# Patient Record
Sex: Female | Born: 1980 | Race: Black or African American | Hispanic: No | Marital: Married | Smoking: Never smoker
Health system: Southern US, Community
[De-identification: ages and names within clinical notes are randomized; demographics above are authoritative.]

## PROBLEM LIST (undated history)

## (undated) ENCOUNTER — Inpatient Hospital Stay (HOSPITAL_COMMUNITY): Payer: Self-pay

## (undated) DIAGNOSIS — D219 Benign neoplasm of connective and other soft tissue, unspecified: Secondary | ICD-10-CM

## (undated) DIAGNOSIS — B54 Unspecified malaria: Secondary | ICD-10-CM

## (undated) DIAGNOSIS — K219 Gastro-esophageal reflux disease without esophagitis: Secondary | ICD-10-CM

## (undated) HISTORY — DX: Unspecified malaria: B54

## (undated) HISTORY — PX: NO PAST SURGERIES: SHX2092

---
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
## 2014-07-26 ENCOUNTER — Encounter: Payer: Self-pay | Admitting: *Deleted

## 2014-08-10 ENCOUNTER — Encounter: Payer: Self-pay | Admitting: Obstetrics and Gynecology

## 2014-08-10 ENCOUNTER — Ambulatory Visit (INDEPENDENT_AMBULATORY_CARE_PROVIDER_SITE_OTHER): Payer: 59 | Admitting: Obstetrics and Gynecology

## 2014-08-10 VITALS — BP 107/79 | HR 66 | Temp 98.1°F | Ht 67.0 in | Wt 160.4 lb

## 2014-08-10 DIAGNOSIS — Z124 Encounter for screening for malignant neoplasm of cervix: Secondary | ICD-10-CM

## 2014-08-10 DIAGNOSIS — Z01419 Encounter for gynecological examination (general) (routine) without abnormal findings: Secondary | ICD-10-CM

## 2014-08-10 DIAGNOSIS — Z1151 Encounter for screening for human papillomavirus (HPV): Secondary | ICD-10-CM

## 2014-08-10 NOTE — Progress Notes (Signed)
Here for annual exam. Also trying to get pregnant-informed her we do not do infertility.

## 2014-08-10 NOTE — Progress Notes (Signed)
  Subjective:     Brandi Hardy is a 34 y.o. female G0 with LMP 08/05/2014 who is here for a comprehensive physical exam. The patient reports no problems. Patient reports monthly menses lasting 5-7 days. She denies any intermenstrual bleeding or pelvic pain. She has been trying to conceive for the past 10 months without success. Her partner has no children and neither smoke.  History   Social History  . Marital Status: Married    Spouse Name: N/A  . Number of Children: N/A  . Years of Education: N/A   Occupational History  . Not on file.   Social History Main Topics  . Smoking status: Never Smoker   . Smokeless tobacco: Never Used  . Alcohol Use: Yes     Comment: occasional  . Drug Use: No  . Sexual Activity: Yes    Birth Control/ Protection: None   Other Topics Concern  . Not on file   Social History Narrative  . No narrative on file   Health Maintenance  Topic Date Due  . HIV Screening  11/11/1994  . PAP SMEAR  11/10/1997  . TETANUS/TDAP  11/11/1998  . INFLUENZA VACCINE  11/28/2014   Past Medical History  Diagnosis Date  . Malaria    History reviewed. No pertinent past surgical history. Family History  Problem Relation Age of Onset  . Parkinson's disease Father    History  Substance Use Topics  . Smoking status: Never Smoker   . Smokeless tobacco: Never Used  . Alcohol Use: Yes     Comment: occasional       Review of Systems Pertinent items are noted in HPI.   Objective:      GENERAL: Well-developed, well-nourished female in no acute distress.  HEENT: Normocephalic, atraumatic. Sclerae anicteric.  NECK: Supple. Normal thyroid.  LUNGS: Clear to auscultation bilaterally.  HEART: Regular rate and rhythm. BREASTS: Symmetric in size. No palpable masses or lymphadenopathy, skin changes, or nipple drainage. ABDOMEN: Soft, nontender, nondistended. No organomegaly. PELVIC: Normal external female genitalia. Vagina is pink and rugated.  Normal  discharge. Normal appearing cervix. Uterus is normal in size. No adnexal mass or tenderness. EXTREMITIES: No cyanosis, clubbing, or edema, 2+ distal pulses.    Assessment:    Healthy female exam.      Plan:     Pap smear collected Reviewed timing of intercourse with the couple. Patient will be contacted with any abnormal results Advised partner to obtain a semen analysis If no conception in the next 6 months, return for referral to infertility or further work up See After Visit Summary for Counseling Recommendations

## 2014-08-12 LAB — CYTOLOGY - PAP

## 2014-11-15 ENCOUNTER — Encounter: Payer: Self-pay | Admitting: Obstetrics and Gynecology

## 2014-11-24 ENCOUNTER — Other Ambulatory Visit: Payer: Self-pay

## 2014-11-24 DIAGNOSIS — Z319 Encounter for procreative management, unspecified: Secondary | ICD-10-CM

## 2015-02-28 ENCOUNTER — Encounter: Payer: Self-pay | Admitting: *Deleted

## 2016-09-18 LAB — OB RESULTS CONSOLE HIV ANTIBODY (ROUTINE TESTING): HIV: NONREACTIVE

## 2016-09-18 LAB — OB RESULTS CONSOLE GC/CHLAMYDIA
CHLAMYDIA, DNA PROBE: NEGATIVE
GC PROBE AMP, GENITAL: NEGATIVE

## 2016-09-18 LAB — OB RESULTS CONSOLE ABO/RH: RH Type: POSITIVE

## 2016-09-18 LAB — OB RESULTS CONSOLE RPR: RPR: NONREACTIVE

## 2016-09-18 LAB — OB RESULTS CONSOLE HEPATITIS B SURFACE ANTIGEN: Hepatitis B Surface Ag: NEGATIVE

## 2016-09-18 LAB — OB RESULTS CONSOLE RUBELLA ANTIBODY, IGM: RUBELLA: IMMUNE

## 2016-09-18 LAB — OB RESULTS CONSOLE ANTIBODY SCREEN: Antibody Screen: NEGATIVE

## 2016-10-02 ENCOUNTER — Inpatient Hospital Stay (HOSPITAL_COMMUNITY): Admission: AD | Admit: 2016-10-02 | Payer: 59 | Source: Ambulatory Visit | Admitting: Obstetrics and Gynecology

## 2016-11-19 ENCOUNTER — Other Ambulatory Visit (HOSPITAL_COMMUNITY): Payer: Self-pay | Admitting: Obstetrics and Gynecology

## 2016-11-19 DIAGNOSIS — Z3689 Encounter for other specified antenatal screening: Secondary | ICD-10-CM

## 2016-11-21 ENCOUNTER — Encounter (HOSPITAL_COMMUNITY): Payer: Self-pay | Admitting: *Deleted

## 2016-11-22 ENCOUNTER — Ambulatory Visit (HOSPITAL_COMMUNITY)
Admission: RE | Admit: 2016-11-22 | Discharge: 2016-11-22 | Disposition: A | Discharge status: Home / Self Care | Payer: 59 | Source: Ambulatory Visit | Attending: Obstetrics and Gynecology | Admitting: Obstetrics and Gynecology

## 2016-11-22 ENCOUNTER — Encounter (HOSPITAL_COMMUNITY): Payer: Self-pay

## 2016-11-22 DIAGNOSIS — O3412 Maternal care for benign tumor of corpus uteri, second trimester: Secondary | ICD-10-CM | POA: Diagnosis not present

## 2016-11-22 DIAGNOSIS — O09529 Supervision of elderly multigravida, unspecified trimester: Secondary | ICD-10-CM | POA: Insufficient documentation

## 2016-11-22 DIAGNOSIS — Z3A20 20 weeks gestation of pregnancy: Secondary | ICD-10-CM | POA: Insufficient documentation

## 2016-11-22 DIAGNOSIS — D563 Thalassemia minor: Secondary | ICD-10-CM

## 2016-11-22 DIAGNOSIS — Z3689 Encounter for other specified antenatal screening: Secondary | ICD-10-CM | POA: Diagnosis present

## 2016-11-22 NOTE — Progress Notes (Signed)
Genetic Counseling  High-Risk Gestation Note  Appointment Date:  11/22/2016 Referred By: Marylynn Pearson, MD Date of Birth:  04-28-1981 Partner:  Vaughan Sine   Pregnancy History: [id number] Estimated Date of Delivery: 04/11/17 Estimated Gestational Age: [redacted]w[redacted]d Attending: Viann Fish, MD   Ms. Tesneem Dufrane and her partner, Mr. Vaughan Sine, were seen for genetic counseling because of a maternal age of 36 y.o. and given that they are both silent alpha-thalassemia carriers.     In summary:  Discussed increased risk for fetal aneuploidy with advanced maternal age  Reviewed results of patient's NIPS (Panorama) and ultrasound- within normal limits  Discussed limitations of screening and option of diagnostic testing  Declined amniocentesis  Patient and partner both silent alpha-thalassemia carriers (aa/a-)  Discussed autosomal recessive inheritance  1 in 4 (25%) chance for offspring to be carrier of alpha thalassemia trait (a-/a-)  Offspring not at increased risk for hemoglobin H disease or Hb Barts  Reviewed family history concerns  They were counseled regarding maternal age and the association with risk for chromosome conditions due to nondisjunction with aging of the ova.   We reviewed chromosomes, nondisjunction, and the associated 1 in 20 risk for fetal aneuploidy related to a maternal age of 36 y.o. at [redacted]w[redacted]d gestation. They were counseled that the risk for aneuploidy decreases as gestational age increases, accounting for those pregnancies which spontaneously abort.  We specifically discussed Down syndrome (trisomy 83), trisomies 85 and 36, and sex chromosome aneuploidies (47,XXX and 47,XXY) including the common features and prognoses of each.   We also reviewed the results of Ms. Roebuck non-invasive prenatal screening (NIPS) and ultrasound.  We discussed that NIPS analyzes placental cell free DNA in maternal circulation to evaluate for the presence of extra chromosome  conditions.  Thus, it is able to provide risk assessment for specific chromosome conditions, but is not diagnostic.  Specifically, Ms. Sachdev had Panorama through [place] [facility]. We reviewed that these are within normal limits, showing a less than 1 in 10,000 risk for trisomies 21, 18 and 13, and monosomy X (Turner syndrome).  In addition, the risk for triploidy and sex chromosome trisomies (47,XXX and 47,XXY) was also low risk.  We reviewed that this testing identifies > 99% of pregnancies with trisomy 6, trisomy 4, sex chromosome trisomies (47,XXX and 47,XXY), and triploidy. The detection rate for trisomy 18 is 96%.  The detection rate for monosomy X is ~92%.  The false positive rate is <0.1% for all conditions. Testing was also consistent with female fetal sex.  They understand that this testing does not identify all genetic conditions.    They were counseled that 50-80% of fetuses with Down syndrome and up to 90% of fetuses with trisomies 45 and 18, when well visualized, have detectable anomalies or soft markers by ultrasound.  Detailed ultrasound was performed today. Visualized fetal anatomy was within normal limits. Complete ultrasound results reported under separate cover.   We also discussed the availability of diagnostic testing by way of amniocentesis.  We reviewed the risks, benefits and limitations of amniocentesis including the approximate 1 in 300-500 risk for pregnancy complications following amniocentesis. We discussed the possible results that the tests might provide including: positive, negative, unanticipated, and no result. Finally, they were counseled regarding the cost of each option and potential out of pocket expenses. After reviewing the above results and the available options, Ms. Darden expressed that she is not interested in pursuing diagnostic testing at this time or in the future, given the associated  risk of complications.  They understand that ultrasound and NIPS cannot rule  out all birth defects or genetic syndromes.    Ms. Austad had expanded pan-ethnic carrier screening ([facility] through [place] [facility]) facilitated through [employer]. The results of the screen were positive for silent carrier status for alpha thalassemia (aa/a-). Her carrier screening was negative for the additional mutations/26 conditions screened, reducing the risk to be a carrier for each of these additional conditions. Mr. Wallis Bamberg also had the same expanded carrier screening, and he was also found to be a silent carrier for alpha thalassemia (aa/a-). Mr. Wallis Bamberg had copies of the laboratory reports from [facility] for himself and Ms. Benscoter documenting these results today.    We reviewed genes and autosomal recessive inheritance. Alpha thalassemia is different in its inheritance compared to other hemoglobinopathies as there are two alpha globin genes on each chromosome 16 (aa/aa).  A person can be a carrier of one alpha gene mutation (aa/a-), also referred to as a "silent carrier" or of more than one mutation.  A person who carriers two alpha globin gene mutations can either carry them in cis (both on the same chromosome, denoted as aa/--) or in trans (on different chromosomes, denoted as a-/a-) .  We discussed that alpha thalassemia carriers of two mutations who have African ancestry are more likely to have a trans arrangements (a-/a-), and individuals with Asian ancestry who are alpha thalassemia carriers usually have a cis arrangement (aa/--) of their alpha globin gene mutations.  Alpha-thalassemia carriers with cis configuration is reported to be rare in individuals with African ancestry.   We discussed different forms of alpha thalassemia. The most severe form of alpha thalassemia, hydrops fetalis with Hb Barts, is associated with an absence of alpha globin chain synthesis as a result of deletions of all four alpha globin genes (--/--).  Hemoglobin H (HbH) disease is caused  by three deleted or dysfunctioning alpha globin alleles (a-/--) and is characterized by microcytic hypochromic hemolytic anemia, hepatosplenomegaly, mild jaundice, and sometimes thalassemia-like bone changes. Given that the couple are each silent alpha thal carriers, their pregnancies together would not be at increased risk for Hemoglobin H disease or Hb Barts. We discussed that each of their pregnancies has a 1 in 4 (25%) chance to be a carrier of alpha thalassemia in trans (a-/a-), a 1 in 2 (50%) chance to be a silent alpha thal carrier (aa/a-), and a 1 in 4 (25%) chance to not be a carrier (aa/aa). In summary, their offspring have an increased chance to be alpha thalassemia carriers but are not at increased risk to inherit alpha thalassemia.   Ms. Kersti Scavone was provided with written information regarding cystic fibrosis (CF), spinal muscular atrophy (SMA) and hemoglobinopathies including the carrier frequency, availability of carrier screening and prenatal diagnosis if indicated.  In addition, we discussed that CF and hemoglobinopathies are routinely screened for as part of the [place] newborn screening panel.  These conditions were included on the carrier screening panel previously performed and were within normal range for the conditions on the panel, with the exception of alpha thalassemia trait, as discussed above.   Both family histories were reviewed and found to be noncontributory for birth defects, intellectual disability, and known genetic conditions. Consanguinity was denied. Without further information regarding the provided family history, an accurate genetic risk cannot be calculated. Further genetic counseling is warranted if more information is obtained.  Ms. Sokha Craker denied exposure to environmental toxins or  chemical agents. She denied the use of alcohol, tobacco or street drugs. She denied significant viral illnesses during the course of her pregnancy. Her medical and  surgical histories were noncontributory.   I counseled this couple regarding the above risks and available options.  The approximate face-to-face time with the genetic counselor was 35 minutes.  Chipper Oman, MS,  Certified [employer] 11/22/2016

## 2016-11-25 ENCOUNTER — Other Ambulatory Visit (HOSPITAL_COMMUNITY): Payer: Self-pay | Admitting: *Deleted

## 2016-11-25 DIAGNOSIS — O09522 Supervision of elderly multigravida, second trimester: Secondary | ICD-10-CM

## 2016-11-26 ENCOUNTER — Other Ambulatory Visit (HOSPITAL_COMMUNITY): Payer: Self-pay | Admitting: *Deleted

## 2016-11-26 ENCOUNTER — Other Ambulatory Visit (HOSPITAL_COMMUNITY): Payer: Self-pay

## 2016-11-26 ENCOUNTER — Encounter (HOSPITAL_COMMUNITY): Payer: Self-pay

## 2016-11-26 DIAGNOSIS — O09529 Supervision of elderly multigravida, unspecified trimester: Secondary | ICD-10-CM

## 2016-12-09 ENCOUNTER — Inpatient Hospital Stay (HOSPITAL_COMMUNITY)
Admission: AD | Admit: 2016-12-09 | Discharge: 2016-12-09 | Disposition: A | Discharge status: Home / Self Care | Payer: 59 | Source: Ambulatory Visit | Attending: Obstetrics and Gynecology | Admitting: Obstetrics and Gynecology

## 2016-12-09 ENCOUNTER — Encounter (HOSPITAL_COMMUNITY): Payer: Self-pay | Admitting: *Deleted

## 2016-12-09 DIAGNOSIS — R109 Unspecified abdominal pain: Secondary | ICD-10-CM | POA: Insufficient documentation

## 2016-12-09 DIAGNOSIS — O3412 Maternal care for benign tumor of corpus uteri, second trimester: Secondary | ICD-10-CM | POA: Insufficient documentation

## 2016-12-09 DIAGNOSIS — R102 Pelvic and perineal pain unspecified side: Secondary | ICD-10-CM

## 2016-12-09 DIAGNOSIS — O26899 Other specified pregnancy related conditions, unspecified trimester: Secondary | ICD-10-CM

## 2016-12-09 DIAGNOSIS — O26892 Other specified pregnancy related conditions, second trimester: Secondary | ICD-10-CM

## 2016-12-09 DIAGNOSIS — Z820 Family history of epilepsy and other diseases of the nervous system: Secondary | ICD-10-CM | POA: Insufficient documentation

## 2016-12-09 DIAGNOSIS — Z3492 Encounter for supervision of normal pregnancy, unspecified, second trimester: Secondary | ICD-10-CM

## 2016-12-09 DIAGNOSIS — D259 Leiomyoma of uterus, unspecified: Secondary | ICD-10-CM | POA: Diagnosis not present

## 2016-12-09 DIAGNOSIS — Z3A22 22 weeks gestation of pregnancy: Secondary | ICD-10-CM | POA: Insufficient documentation

## 2016-12-09 HISTORY — DX: Gastro-esophageal reflux disease without esophagitis: K21.9

## 2016-12-09 HISTORY — DX: Benign neoplasm of connective and other soft tissue, unspecified: D21.9

## 2016-12-09 LAB — URINALYSIS, ROUTINE W REFLEX MICROSCOPIC
Bilirubin Urine: NEGATIVE
Glucose, UA: NEGATIVE mg/dL
Hgb urine dipstick: NEGATIVE
KETONES UR: NEGATIVE mg/dL
LEUKOCYTES UA: NEGATIVE
Nitrite: NEGATIVE
PROTEIN: NEGATIVE mg/dL
Specific Gravity, Urine: 1.011 (ref 1.005–1.030)
pH: 7 (ref 5.0–8.0)

## 2016-12-09 NOTE — MAU Note (Signed)
Pt C/O lower abd pain since last night, also cramping in LLQ.  Denies bleeding or LOF.

## 2016-12-09 NOTE — MAU Provider Note (Signed)
History     CSN: [id number]  Arrival date and time: 12/09/16 1815  First Provider Initiated Contact with Patient 12/09/16 1944      Chief Complaint  Patient presents with  . Abdominal Pain   HPI Brandi Hardy is a 36 y.o. G2P0010 at [redacted]w[redacted]d who presents with abdominal pain. Symptoms began yesterday. Reports sharp lower abdominal pain that are intermittent. Rates pain 6/10 when it occurs. Pain worse with walking & position changes. Has not treated pain. Denies n/v/d, constipation, dysuria, vaginal bleeding, LOF. Positive fetal movement. Pregnancy complicated by uterine fibroids (7x6 cm & 3cm).   OB History    Gravida Para Term Preterm AB Living   2 0 0 0 1 0   SAB TAB Ectopic Multiple Live Births   1 0 0 0        Past Medical History:  Diagnosis Date  . Fibroid   . GERD (gastroesophageal reflux disease)   . Malaria 1988    Past Surgical History:  Procedure Laterality Date  . NO PAST SURGERIES      Family History  Problem Relation Age of Onset  . Parkinson's disease Father     Social History  Substance Use Topics  . Smoking status: Never Smoker  . Smokeless tobacco: Never Used  . Alcohol use Yes     Comment: occasional; not while preg    Allergies: No Known Allergies  Prescriptions Prior to Admission  Medication Sig Dispense Refill Last Dose  . Multiple Vitamin (MULTIVITAMIN) tablet Take 1 tablet by mouth daily.   12/09/2016 at Unknown time  . Prenatal Vit w/Fe-Methylfol-FA (PNV PO) Take by mouth.   Taking    Review of Systems  Constitutional: Negative.   Gastrointestinal: Positive for abdominal pain. Negative for constipation, diarrhea, nausea and vomiting.  Genitourinary: Negative for dysuria, vaginal bleeding and vaginal discharge.   Physical Exam   Blood pressure 118/77, pulse 94, temperature 98.4 F (36.9 C), temperature source Oral, resp. rate 18, last menstrual period 07/05/2016.  Physical Exam  Nursing note and vitals  reviewed. Constitutional: She is oriented to person, place, and time. She appears well-developed and well-nourished. No distress.  HENT:  Head: Normocephalic and atraumatic.  Eyes: Conjunctivae are normal. Right eye exhibits no discharge. Left eye exhibits no discharge. No scleral icterus.  Neck: Normal range of motion.  Cardiovascular: Normal rate, regular rhythm and normal heart sounds.   No murmur heard. Respiratory: Effort normal and breath sounds normal. No respiratory distress. She has no wheezes.  GI: Soft. Bowel sounds are normal. There is no tenderness.  Neurological: She is alert and oriented to person, place, and time.  Skin: Skin is warm and dry. She is not diaphoretic.  Psychiatric: She has a normal mood and affect. Her behavior is normal. Judgment and thought content normal.   Dilation: Closed Effacement (%): Thick Cervical Position: Middle Exam by:: Jorje Guild  NP  MAU Course  Procedures Results for orders placed or performed during the hospital encounter of 12/09/16 (from the past 24 hour(s))  Urinalysis, Routine w reflex microscopic     Status: None   Collection Time: 12/09/16  6:38 PM  Result Value Ref Range   Color, Urine YELLOW YELLOW   APPearance CLEAR CLEAR   Specific Gravity, Urine 1.011 1.005 - 1.030   pH 7.0 5.0 - 8.0   Glucose, UA NEGATIVE NEGATIVE mg/dL   Hgb urine dipstick NEGATIVE NEGATIVE   Bilirubin Urine NEGATIVE NEGATIVE   Ketones, ur NEGATIVE NEGATIVE mg/dL  Protein, ur NEGATIVE NEGATIVE mg/dL   Nitrite NEGATIVE NEGATIVE   Leukocytes, UA NEGATIVE NEGATIVE    MDM FHT 142 Cervix closed/thick S/w Dr. Julien Girt. Ok to discharge home  Assessment and Plan  A; 1. Pain of round ligament during pregnancy   2. Uterine fibroids affecting pregnancy in second trimester   3. Fetal heart tones present, second trimester    P: Discharge home Discussed use of maternity support belt & slow position changes Discussed reasons to return to MAU Keep  f/u with OB  Jorje Guild 12/09/2016, 7:43 PM

## 2016-12-09 NOTE — MAU Note (Signed)
Pt cervix was checked at 2002 by Elsie Lincoln, CNM.  Pt cervix is closed.

## 2016-12-09 NOTE — Discharge Instructions (Signed)
Recommend pregnancy support belt/maternity support belt.  One brand is called Prenatal Cradle You may find these on [place].[email or web address], [employer].com, or [employer]com     Round Ligament Pain The round ligament is a cord of muscle and tissue that helps to support the uterus. It can become a source of pain during pregnancy if it becomes stretched or twisted as the baby grows. The pain usually begins in the second trimester of pregnancy, and it can come and go until the baby is delivered. It is not a serious problem, and it does not cause harm to the baby. Round ligament pain is usually a short, sharp, and pinching pain, but it can also be a dull, lingering, and aching pain. The pain is felt in the lower side of the abdomen or in the groin. It usually starts deep in the groin and moves up to the outside of the hip area. Pain can occur with:  A sudden change in position.  Rolling over in bed.  Coughing or sneezing.  Physical activity.  Follow these instructions at home: Watch your condition for any changes. Take these steps to help with your pain:  When the pain starts, relax. Then try: ? Sitting down. ? Flexing your knees up to your abdomen. ? Lying on your side with one pillow under your abdomen and another pillow between your legs. ? Sitting in a warm bath for 15-20 minutes or until the pain goes away.  Take over-the-counter and prescription medicines only as told by your health care provider.  Move slowly when you sit and stand.  Avoid long walks if they cause pain.  Stop or lessen your physical activities if they cause pain.  Contact a health care provider if:  Your pain does not go away with treatment.  You feel pain in your back that you did not have before.  Your medicine is not helping. Get help right away if:  You develop a fever or chills.  You develop uterine contractions.  You develop vaginal bleeding.  You develop nausea or vomiting.  You develop diarrhea.  You  have pain when you urinate. This information is not intended to replace advice given to you by your health care provider. Make sure you discuss any questions you have with your health care provider. Document Released: 01/23/2008 Document Revised: 09/21/2015 Document Reviewed: 06/22/2014 Elsevier Interactive Patient Education  [employer].

## 2017-01-03 ENCOUNTER — Encounter (HOSPITAL_COMMUNITY): Payer: Self-pay

## 2017-01-03 ENCOUNTER — Ambulatory Visit (HOSPITAL_COMMUNITY)
Admission: RE | Admit: 2017-01-03 | Discharge: 2017-01-03 | Disposition: A | Discharge status: Home / Self Care | Payer: 59 | Source: Ambulatory Visit | Attending: Obstetrics and Gynecology | Admitting: Obstetrics and Gynecology

## 2017-01-03 DIAGNOSIS — O99012 Anemia complicating pregnancy, second trimester: Secondary | ICD-10-CM | POA: Diagnosis not present

## 2017-01-03 DIAGNOSIS — Z3A26 26 weeks gestation of pregnancy: Secondary | ICD-10-CM | POA: Diagnosis not present

## 2017-01-03 DIAGNOSIS — O3412 Maternal care for benign tumor of corpus uteri, second trimester: Secondary | ICD-10-CM | POA: Diagnosis not present

## 2017-01-03 DIAGNOSIS — O09529 Supervision of elderly multigravida, unspecified trimester: Secondary | ICD-10-CM

## 2017-01-03 DIAGNOSIS — D563 Thalassemia minor: Secondary | ICD-10-CM

## 2017-01-03 DIAGNOSIS — D259 Leiomyoma of uterus, unspecified: Secondary | ICD-10-CM | POA: Diagnosis not present

## 2017-01-03 DIAGNOSIS — O09522 Supervision of elderly multigravida, second trimester: Secondary | ICD-10-CM | POA: Diagnosis not present

## 2017-02-14 ENCOUNTER — Encounter (HOSPITAL_COMMUNITY): Payer: Self-pay

## 2017-02-14 ENCOUNTER — Ambulatory Visit (HOSPITAL_COMMUNITY)
Admission: RE | Admit: 2017-02-14 | Discharge: 2017-02-14 | Disposition: A | Discharge status: Home / Self Care | Payer: 59 | Source: Ambulatory Visit | Attending: Obstetrics and Gynecology | Admitting: Obstetrics and Gynecology

## 2017-02-14 DIAGNOSIS — D259 Leiomyoma of uterus, unspecified: Secondary | ICD-10-CM | POA: Diagnosis not present

## 2017-02-14 DIAGNOSIS — D563 Thalassemia minor: Secondary | ICD-10-CM

## 2017-02-14 DIAGNOSIS — O09529 Supervision of elderly multigravida, unspecified trimester: Secondary | ICD-10-CM

## 2017-02-14 DIAGNOSIS — D569 Thalassemia, unspecified: Secondary | ICD-10-CM | POA: Insufficient documentation

## 2017-02-14 DIAGNOSIS — Z3A32 32 weeks gestation of pregnancy: Secondary | ICD-10-CM | POA: Diagnosis not present

## 2017-02-14 DIAGNOSIS — O09893 Supervision of other high risk pregnancies, third trimester: Secondary | ICD-10-CM | POA: Insufficient documentation

## 2017-02-14 DIAGNOSIS — O99013 Anemia complicating pregnancy, third trimester: Secondary | ICD-10-CM | POA: Diagnosis present

## 2017-02-14 DIAGNOSIS — O09523 Supervision of elderly multigravida, third trimester: Secondary | ICD-10-CM | POA: Insufficient documentation

## 2017-02-14 DIAGNOSIS — O3413 Maternal care for benign tumor of corpus uteri, third trimester: Secondary | ICD-10-CM | POA: Diagnosis present

## 2017-03-14 ENCOUNTER — Inpatient Hospital Stay (HOSPITAL_COMMUNITY)
Admission: AD | Admit: 2017-03-14 | Discharge: 2017-03-14 | Disposition: A | Discharge status: Home / Self Care | Payer: 59 | Source: Ambulatory Visit | Attending: Obstetrics and Gynecology | Admitting: Obstetrics and Gynecology

## 2017-03-14 ENCOUNTER — Encounter (HOSPITAL_COMMUNITY): Payer: Self-pay | Admitting: *Deleted

## 2017-03-14 DIAGNOSIS — O99113 Other diseases of the blood and blood-forming organs and certain disorders involving the immune mechanism complicating pregnancy, third trimester: Secondary | ICD-10-CM | POA: Insufficient documentation

## 2017-03-14 DIAGNOSIS — D259 Leiomyoma of uterus, unspecified: Secondary | ICD-10-CM | POA: Insufficient documentation

## 2017-03-14 DIAGNOSIS — Z3A36 36 weeks gestation of pregnancy: Secondary | ICD-10-CM | POA: Diagnosis not present

## 2017-03-14 DIAGNOSIS — D563 Thalassemia minor: Secondary | ICD-10-CM | POA: Insufficient documentation

## 2017-03-14 DIAGNOSIS — O09523 Supervision of elderly multigravida, third trimester: Secondary | ICD-10-CM | POA: Diagnosis not present

## 2017-03-14 DIAGNOSIS — O26893 Other specified pregnancy related conditions, third trimester: Secondary | ICD-10-CM | POA: Diagnosis not present

## 2017-03-14 DIAGNOSIS — R109 Unspecified abdominal pain: Secondary | ICD-10-CM | POA: Diagnosis not present

## 2017-03-14 DIAGNOSIS — Z820 Family history of epilepsy and other diseases of the nervous system: Secondary | ICD-10-CM | POA: Insufficient documentation

## 2017-03-14 DIAGNOSIS — O3413 Maternal care for benign tumor of corpus uteri, third trimester: Secondary | ICD-10-CM | POA: Insufficient documentation

## 2017-03-14 DIAGNOSIS — M7918 Myalgia, other site: Secondary | ICD-10-CM | POA: Diagnosis not present

## 2017-03-14 DIAGNOSIS — O09529 Supervision of elderly multigravida, unspecified trimester: Secondary | ICD-10-CM

## 2017-03-14 DIAGNOSIS — R1031 Right lower quadrant pain: Secondary | ICD-10-CM | POA: Diagnosis present

## 2017-03-14 LAB — URINALYSIS, ROUTINE W REFLEX MICROSCOPIC
Bilirubin Urine: NEGATIVE
GLUCOSE, UA: NEGATIVE mg/dL
Hgb urine dipstick: NEGATIVE
KETONES UR: NEGATIVE mg/dL
LEUKOCYTES UA: NEGATIVE
NITRITE: NEGATIVE
PROTEIN: NEGATIVE mg/dL
Specific Gravity, Urine: 1.012 (ref 1.005–1.030)
pH: 6 (ref 5.0–8.0)

## 2017-03-14 LAB — CBC
HEMATOCRIT: 35.6 % — AB (ref 36.0–46.0)
HEMOGLOBIN: 11.5 g/dL — AB (ref 12.0–15.0)
MCH: 28.2 pg (ref 26.0–34.0)
MCHC: 32.3 g/dL (ref 30.0–36.0)
MCV: 87.3 fL (ref 78.0–100.0)
Platelets: 195 10*3/uL (ref 150–400)
RBC: 4.08 MIL/uL (ref 3.87–5.11)
RDW: 13.7 % (ref 11.5–15.5)
WBC: 7.5 10*3/uL (ref 4.0–10.5)

## 2017-03-14 NOTE — MAU Note (Signed)
Pain on rt side of abd, started this morning. Comes and goes. Denies bleeding or leaking. Denies problems with preg. No GI complaints.  Denies HA, visual changes, some swelling in feet.

## 2017-03-14 NOTE — MAU Provider Note (Signed)
Chief Complaint:  Abdominal Pain   First Provider Initiated Contact with Patient 03/14/17 [phone]      HPI: Brandi Hardy is a 36 y.o. G2P0010 at 23w0dwho presents to maternity admissions reporting onset of severe right lower quadrant abdominal pain this morning. The pain is intermittent, every few minutes, and unchanged in intensity since onset.  Nothing makes the pain better or worse, she is drinking fluids and has been resting but has not tried any other treatments. There is no n/v, fever/chills, or any other associated symptoms.  She has known multiple uterine fibroids, largest 7x5x6 cm anterior left side of uterus.  She reports good fetal movement, denies LOF, vaginal bleeding, vaginal itching/burning, urinary symptoms, h/a, dizziness, n/v, or fever/chills.    HPI  Past Medical History: Past Medical History:  Diagnosis Date  . Fibroid   . GERD (gastroesophageal reflux disease)   . Malaria 1988    Past obstetric history: OB History  Gravida Para Term Preterm AB Living  2 0 0 0 1 0  SAB TAB Ectopic Multiple Live Births  1 0 0 0      # Outcome Date GA Lbr Len/2nd Weight Sex Delivery Anes PTL Lv  2 Current           1 SAB 07/2015              Past Surgical History: Past Surgical History:  Procedure Laterality Date  . NO PAST SURGERIES      Family History: Family History  Problem Relation Age of Onset  . Parkinson's disease Father     Social History: Social History   Tobacco Use  . Smoking status: Never Smoker  . Smokeless tobacco: Never Used  Substance Use Topics  . Alcohol use: Yes    Comment: occasional; not while preg  . Drug use: No    Allergies: No Known Allergies  Meds:  No medications prior to admission.    ROS:  Review of Systems  Constitutional: Negative for chills, fatigue and fever.  Eyes: Negative for visual disturbance.  Respiratory: Negative for shortness of breath.   Cardiovascular: Negative for chest pain.  Gastrointestinal:  Positive for abdominal pain. Negative for nausea and vomiting.  Genitourinary: Negative for difficulty urinating, dysuria, flank pain, pelvic pain, vaginal bleeding, vaginal discharge and vaginal pain.  Musculoskeletal: Negative for back pain.  Neurological: Negative for dizziness and headaches.  Psychiatric/Behavioral: Negative.      I have reviewed patient's Past Medical Hx, Surgical Hx, Family Hx, Social Hx, medications and allergies.   Physical Exam   Patient Vitals for the past 24 hrs:  BP Temp Temp src Pulse Resp SpO2 Weight  03/14/17 1007 129/81 - - 87 - - -  03/14/17 0827 134/76 - - 87 - (!) 86 % -  03/14/17 0826 - 97.9 F (36.6 C) Oral 87 16 100 % -  03/14/17 0807 - - - - - - 211 lb 4 oz (95.8 kg)   Constitutional: Well-developed, well-nourished female in no acute distress.  Cardiovascular: normal rate Respiratory: normal effort GI: Abd soft, non-tender, gravid appropriate for gestational age.  MS: Extremities nontender, no edema, normal ROM Neurologic: Alert and oriented x 4.  GU: Neg CVAT.  PELVIC EXAM: Cervix pink, visually closed, without lesion, scant white creamy discharge, vaginal walls and external genitalia normal Bimanual exam: Cervix 0/long/high, firm, anterior, neg CMT, uterus nontender, nonenlarged, adnexa without tenderness, enlargement, or mass  Dilation: Closed Effacement (%): Thick Cervical Position: Middle Station: Ballotable Exam by::  leftwich-kirby,CNM  FHT:  Baseline 135 , moderate variability, accelerations present, no decelerations Contractions: q 3-5 mins   Labs: Results for orders placed or performed during the hospital encounter of 03/14/17 (from the past 24 hour(s))  Urinalysis, Routine w reflex microscopic     Status: Abnormal   Collection Time: 03/14/17  8:00 AM  Result Value Ref Range   Color, Urine YELLOW YELLOW   APPearance HAZY (A) CLEAR   Specific Gravity, Urine 1.012 1.005 - 1.030   pH 6.0 5.0 - 8.0   Glucose, UA NEGATIVE  NEGATIVE mg/dL   Hgb urine dipstick NEGATIVE NEGATIVE   Bilirubin Urine NEGATIVE NEGATIVE   Ketones, ur NEGATIVE NEGATIVE mg/dL   Protein, ur NEGATIVE NEGATIVE mg/dL   Nitrite NEGATIVE NEGATIVE   Leukocytes, UA NEGATIVE NEGATIVE  CBC     Status: Abnormal   Collection Time: 03/14/17  8:52 AM  Result Value Ref Range   WBC 7.5 4.0 - 10.5 K/uL   RBC 4.08 3.87 - 5.11 MIL/uL   Hemoglobin 11.5 (L) 12.0 - 15.0 g/dL   HCT 35.6 (L) 36.0 - 46.0 %   MCV 87.3 78.0 - 100.0 fL   MCH 28.2 26.0 - 34.0 pg   MCHC 32.3 30.0 - 36.0 g/dL   RDW 13.7 11.5 - 15.5 %   Platelets 195 150 - 400 K/uL      Imaging:    MAU Course/MDM: I have ordered labs and reviewed results.  NST reviewed and reactive Unlikely appendicitis with no elevated WBCs, no fever/chills or n/v, and no rebound tenderness.  No evidence of UTI.  No acute abdomen on exam.   Likely fibroids causing pain and/or musculoskeletal or round ligament pain Rest/ice/heat/warm bath/Tylenol/pregnancy support belt for pain Consult [place] with presentation, exam findings and test results. Reviewed NST with Dr Gaetano Net. Reassurance provided to pt Keep scheduled appt on Monday, return to MAU with signs of labor or emergencies  Pt discharge with strict abdominal pain and labor precautions.   Assessment: 1. Abdominal pain during pregnancy in third trimester   2. Alpha thalassemia silent carrier   3. Antepartum multigravida of advanced maternal age   11. Uterine leiomyoma, unspecified location   5. Musculoskeletal pain     Plan: Discharge home Labor precautions and fetal kick counts Follow-up Information    [place] Follow up.   Why:  As scheduled next week, return to MAU as needed for emergencies Contact information: [address] [phone]          Allergies as of 03/14/2017   No Known Allergies     Medication List    TAKE these medications   calcium carbonate  500 MG chewable tablet Commonly known as:  TUMS - dosed in mg elemental calcium Chew 2 tablets daily as needed by mouth for indigestion or heartburn.   IRON PO Take by mouth.   PNV PO Take by mouth.       Fatima Blank Certified Nurse-Midwife 03/14/2017 11:03 AM

## 2017-03-14 NOTE — Discharge Instructions (Signed)

## 2017-03-18 LAB — OB RESULTS CONSOLE GBS: STREP GROUP B AG: POSITIVE

## 2017-03-27 ENCOUNTER — Inpatient Hospital Stay (HOSPITAL_COMMUNITY)
Admission: AD | Admit: 2017-03-27 | Discharge: 2017-03-31 | DRG: 788 | Disposition: A | Discharge status: Home / Self Care | Payer: 59 | Source: Ambulatory Visit | Attending: Obstetrics and Gynecology | Admitting: Obstetrics and Gynecology

## 2017-03-27 ENCOUNTER — Encounter (HOSPITAL_COMMUNITY): Payer: Self-pay | Admitting: Anesthesiology

## 2017-03-27 ENCOUNTER — Inpatient Hospital Stay (HOSPITAL_COMMUNITY): Payer: 59

## 2017-03-27 ENCOUNTER — Encounter (HOSPITAL_COMMUNITY): Payer: Self-pay | Admitting: *Deleted

## 2017-03-27 ENCOUNTER — Other Ambulatory Visit: Payer: Self-pay

## 2017-03-27 DIAGNOSIS — Z3A37 37 weeks gestation of pregnancy: Secondary | ICD-10-CM

## 2017-03-27 DIAGNOSIS — O1494 Unspecified pre-eclampsia, complicating childbirth: Secondary | ICD-10-CM | POA: Diagnosis present

## 2017-03-27 DIAGNOSIS — O1490 Unspecified pre-eclampsia, unspecified trimester: Secondary | ICD-10-CM | POA: Diagnosis present

## 2017-03-27 DIAGNOSIS — O1493 Unspecified pre-eclampsia, third trimester: Secondary | ICD-10-CM | POA: Diagnosis not present

## 2017-03-27 DIAGNOSIS — Z98891 History of uterine scar from previous surgery: Secondary | ICD-10-CM

## 2017-03-27 DIAGNOSIS — O134 Gestational [pregnancy-induced] hypertension without significant proteinuria, complicating childbirth: Secondary | ICD-10-CM | POA: Diagnosis present

## 2017-03-27 DIAGNOSIS — O09529 Supervision of elderly multigravida, unspecified trimester: Secondary | ICD-10-CM

## 2017-03-27 DIAGNOSIS — D259 Leiomyoma of uterus, unspecified: Secondary | ICD-10-CM | POA: Diagnosis present

## 2017-03-27 DIAGNOSIS — O4693 Antepartum hemorrhage, unspecified, third trimester: Secondary | ICD-10-CM | POA: Diagnosis not present

## 2017-03-27 DIAGNOSIS — D563 Thalassemia minor: Secondary | ICD-10-CM

## 2017-03-27 DIAGNOSIS — O288 Other abnormal findings on antenatal screening of mother: Secondary | ICD-10-CM | POA: Diagnosis not present

## 2017-03-27 DIAGNOSIS — O4690 Antepartum hemorrhage, unspecified, unspecified trimester: Secondary | ICD-10-CM

## 2017-03-27 DIAGNOSIS — O3413 Maternal care for benign tumor of corpus uteri, third trimester: Secondary | ICD-10-CM | POA: Diagnosis present

## 2017-03-27 LAB — URINALYSIS, ROUTINE W REFLEX MICROSCOPIC
BILIRUBIN URINE: NEGATIVE
GLUCOSE, UA: NEGATIVE mg/dL
KETONES UR: NEGATIVE mg/dL
NITRITE: NEGATIVE
PH: 6 (ref 5.0–8.0)
PROTEIN: 30 mg/dL — AB
Specific Gravity, Urine: 1.015 (ref 1.005–1.030)

## 2017-03-27 LAB — ABO/RH: ABO/RH(D): A POS

## 2017-03-27 LAB — PROTEIN / CREATININE RATIO, URINE
CREATININE, URINE: 99 mg/dL
Protein Creatinine Ratio: 0.36 mg/mg{Cre} — ABNORMAL HIGH (ref 0.00–0.15)
Total Protein, Urine: 36 mg/dL

## 2017-03-27 LAB — COMPREHENSIVE METABOLIC PANEL
ALBUMIN: 3.2 g/dL — AB (ref 3.5–5.0)
ALT: 18 U/L (ref 14–54)
AST: 25 U/L (ref 15–41)
Alkaline Phosphatase: 91 U/L (ref 38–126)
Anion gap: 10 (ref 5–15)
BILIRUBIN TOTAL: 0.4 mg/dL (ref 0.3–1.2)
BUN: 12 mg/dL (ref 6–20)
CO2: 19 mmol/L — ABNORMAL LOW (ref 22–32)
CREATININE: 0.61 mg/dL (ref 0.44–1.00)
Calcium: 9.4 mg/dL (ref 8.9–10.3)
Chloride: 106 mmol/L (ref 101–111)
GFR calc Af Amer: >60 mL/min (ref >60)
GLUCOSE: 80 mg/dL (ref 65–99)
POTASSIUM: 4.1 mmol/L (ref 3.5–5.1)
Sodium: 135 mmol/L (ref 135–145)
TOTAL PROTEIN: 6.5 g/dL (ref 6.5–8.1)

## 2017-03-27 LAB — CBC
HCT: 38 % (ref 36.0–46.0)
Hemoglobin: 12.2 g/dL (ref 12.0–15.0)
MCH: 28 pg (ref 26.0–34.0)
MCHC: 32.1 g/dL (ref 30.0–36.0)
MCV: 87.2 fL (ref 78.0–100.0)
Platelets: 182 10*3/uL (ref 150–400)
RBC: 4.36 MIL/uL (ref 3.87–5.11)
RDW: 13.8 % (ref 11.5–15.5)
WBC: 7.5 10*3/uL (ref 4.0–10.5)

## 2017-03-27 LAB — TYPE AND SCREEN
ABO/RH(D): A POS
ANTIBODY SCREEN: NEGATIVE

## 2017-03-27 MED ORDER — PENICILLIN G POTASSIUM 5000000 UNITS IJ SOLR
5.0000 10*6.[IU] | Freq: Once | INTRAMUSCULAR | Status: AC
  Administered 2017-03-28: 5 10*6.[IU] via INTRAVENOUS
  Filled 2017-03-27: qty 5

## 2017-03-27 MED ORDER — ACETAMINOPHEN 325 MG PO TABS: 650.0000 mg | ORAL_TABLET | ORAL | Status: DC | PRN

## 2017-03-27 MED ORDER — LACTATED RINGERS IV SOLN: 500.0000 mL | INTRAVENOUS | Status: DC | PRN

## 2017-03-27 MED ORDER — PENICILLIN G POT IN DEXTROSE 60000 UNIT/ML IV SOLN
3.0000 10*6.[IU] | INTRAVENOUS | Status: DC
  Filled 2017-03-27 (×7): qty 50

## 2017-03-27 MED ORDER — OXYTOCIN BOLUS FROM INFUSION: 500.0000 mL | Freq: Once | INTRAVENOUS | Status: DC

## 2017-03-27 MED ORDER — OXYCODONE-ACETAMINOPHEN 5-325 MG PO TABS: 1.0000 | ORAL_TABLET | ORAL | Status: DC | PRN

## 2017-03-27 MED ORDER — MISOPROSTOL 25 MCG QUARTER TABLET
25.0000 ug | ORAL_TABLET | ORAL | Status: DC | PRN
  Administered 2017-03-27 – 2017-03-28 (×2): 25 ug via VAGINAL
  Filled 2017-03-27 (×3): qty 1

## 2017-03-27 MED ORDER — FLEET ENEMA 7-19 GM/118ML RE ENEM: 1.0000 | ENEMA | RECTAL | Status: DC | PRN

## 2017-03-27 MED ORDER — SOD CITRATE-CITRIC ACID 500-334 MG/5ML PO SOLN
30.0000 mL | ORAL | Status: DC | PRN
  Administered 2017-03-28: 30 mL via ORAL
  Filled 2017-03-27: qty 15

## 2017-03-27 MED ORDER — LACTATED RINGERS IV SOLN
INTRAVENOUS | Status: DC
  Administered 2017-03-27 – 2017-03-28 (×3): via INTRAVENOUS

## 2017-03-27 MED ORDER — OXYCODONE-ACETAMINOPHEN 5-325 MG PO TABS: 2.0000 | ORAL_TABLET | ORAL | Status: DC | PRN

## 2017-03-27 MED ORDER — TERBUTALINE SULFATE 1 MG/ML IJ SOLN: 0.2500 mg | Freq: Once | INTRAMUSCULAR | Status: DC | PRN

## 2017-03-27 MED ORDER — ONDANSETRON HCL 4 MG/2ML IJ SOLN: 4.0000 mg | Freq: Four times a day (QID) | INTRAMUSCULAR | Status: DC | PRN

## 2017-03-27 MED ORDER — LIDOCAINE HCL (PF) 1 % IJ SOLN: 30.0000 mL | INTRAMUSCULAR | Status: DC | PRN

## 2017-03-27 MED ORDER — OXYTOCIN 40 UNITS IN LACTATED RINGERS INFUSION - SIMPLE MED: 2.5000 [IU]/h | INTRAVENOUS | Status: DC

## 2017-03-27 MED ORDER — ZOLPIDEM TARTRATE 5 MG PO TABS: 5.0000 mg | ORAL_TABLET | Freq: Every evening | ORAL | Status: DC | PRN

## 2017-03-27 NOTE — MAU Note (Signed)
+  vaginal bleeding Dark red in color; no clots  +lower abdominal pain Tight in nature Constant Rating pain 6/10  Denies any complications with this pregnancy  Was last seen Monday in the office and states cervix was closed

## 2017-03-27 NOTE — MAU Provider Note (Signed)
History     CSN: [id number]  Arrival date and time: 03/27/17 1228   First Provider Initiated Contact with Patient 03/27/17 1302      Chief Complaint  Patient presents with  . Vaginal Bleeding  . Labor Eval   G2P0010 @37 .6 wks here with spotting. Noticed brown blood in her underwear this am. No LOF or ctx. Reports some constant LAP over the last week. Worse when she bends at her hips. Hasn't taken anything for it. Reports good FM. Pregnancy has been uncomplicated.   OB History    Gravida Para Term Preterm AB Living   2 0 0 0 1 0   SAB TAB Ectopic Multiple Live Births   1 0 0 0        Past Medical History:  Diagnosis Date  . Fibroid   . GERD (gastroesophageal reflux disease)   . Malaria 1988    Past Surgical History:  Procedure Laterality Date  . NO PAST SURGERIES      Family History  Problem Relation Age of Onset  . Parkinson's disease Father     Social History   Tobacco Use  . Smoking status: Never Smoker  . Smokeless tobacco: Never Used  Substance Use Topics  . Alcohol use: Yes    Comment: occasional; not while preg  . Drug use: No    Allergies: No Known Allergies  Medications Prior to Admission  Medication Sig Dispense Refill Last Dose  . calcium carbonate (TUMS - DOSED IN MG ELEMENTAL CALCIUM) 500 MG chewable tablet Chew 2 tablets daily as needed by mouth for indigestion or heartburn.   Past Week at Unknown time  . Prenatal Vit-Fe Fumarate-FA (PRENATAL MULTIVITAMIN) TABS tablet Take 1 tablet by mouth daily at 12 noon.   03/27/2017 at Unknown time    Review of Systems  Eyes: Negative for visual disturbance.  Respiratory: Negative for shortness of breath.   Cardiovascular: Negative for chest pain.  Gastrointestinal: Positive for abdominal pain (low).  Genitourinary: Positive for vaginal bleeding.  Neurological: Negative for headaches.   Physical Exam   Blood pressure (!) 149/86, pulse 77, temperature 97.6 F (36.4 C), temperature source Oral,  resp. rate 16, last menstrual period 07/05/2016, SpO2 100 %.  Patient Vitals for the past 24 hrs:  BP Temp Temp src Pulse Resp SpO2  03/27/17 1653 (!) 149/86 - - 77 - -  03/27/17 1410 (!) 140/95 - - 87 - -  03/27/17 1400 128/82 - - 85 - -  03/27/17 1350 (!) 127/91 - - 89 - -  03/27/17 1340 133/81 - - 77 - -  03/27/17 1330 (!) 135/91 - - 86 - -  03/27/17 1320 (!) 127/92 - - 81 - -  03/27/17 1310 129/82 - - 74 - -  03/27/17 1300 139/88 - - 79 - 100 %  03/27/17 1251 137/88 - - 81 - -  03/27/17 1250 (!) 142/88 - - 77 - -  03/27/17 1247 (!) 135/91 - - 100 - -  03/27/17 1246 136/87 97.6 F (36.4 C) Oral 92 16 100 %    Physical Exam  Constitutional: She is oriented to person, place, and time. She appears well-developed and well-nourished. No distress.  HENT:  Head: Normocephalic and atraumatic.  Neck: Normal range of motion.  Cardiovascular: Normal rate.  Respiratory: Effort normal.  GI: Soft. She exhibits no distension. There is no tenderness.  vtx by leopolds  Genitourinary:  Genitourinary Comments: Closed/thick posterior per RN exam Speculum: small drk brown thick  discharge, no active bleeding  Musculoskeletal: Normal range of motion. She exhibits edema (2+ BLE).  Neurological: She is alert and oriented to person, place, and time.  Skin: Skin is warm and dry.  Psychiatric: She has a normal mood and affect.  EFM: 135 bpm, mod variability, 10x10 accels, no decels Toco: irregular  Results for orders placed or performed during the hospital encounter of 03/27/17 (from the past 24 hour(s))  Protein / creatinine ratio, urine     Status: Abnormal   Collection Time: 03/27/17 12:33 PM  Result Value Ref Range   Creatinine, Urine 99.00 mg/dL   Total Protein, Urine 36 mg/dL   Protein Creatinine Ratio 0.36 (H) 0.00 - 0.15 mg/mg[Cre]  Urinalysis, Routine w reflex microscopic     Status: Abnormal   Collection Time: 03/27/17 12:33 PM  Result Value Ref Range   Color, Urine YELLOW YELLOW    APPearance CLOUDY (A) CLEAR   Specific Gravity, Urine 1.015 1.005 - 1.030   pH 6.0 5.0 - 8.0   Glucose, UA NEGATIVE NEGATIVE mg/dL   Hgb urine dipstick LARGE (A) NEGATIVE   Bilirubin Urine NEGATIVE NEGATIVE   Ketones, ur NEGATIVE NEGATIVE mg/dL   Protein, ur 30 (A) NEGATIVE mg/dL   Nitrite NEGATIVE NEGATIVE   Leukocytes, UA TRACE (A) NEGATIVE   RBC / HPF TOO NUMEROUS TO COUNT 0 - 5 RBC/hpf   WBC, UA 6-30 0 - 5 WBC/hpf   Bacteria, UA FEW (A) NONE SEEN   Squamous Epithelial / LPF 6-30 (A) NONE SEEN   WBC Clumps PRESENT    Mucus PRESENT   CBC     Status: None   Collection Time: 03/27/17  1:15 PM  Result Value Ref Range   WBC 7.5 4.0 - 10.5 K/uL   RBC 4.36 3.87 - 5.11 MIL/uL   Hemoglobin 12.2 12.0 - 15.0 g/dL   HCT 38.0 36.0 - 46.0 %   MCV 87.2 78.0 - 100.0 fL   MCH 28.0 26.0 - 34.0 pg   MCHC 32.1 30.0 - 36.0 g/dL   RDW 13.8 11.5 - 15.5 %   Platelets 182 150 - 400 K/uL  Comprehensive metabolic panel     Status: Abnormal   Collection Time: 03/27/17  1:15 PM  Result Value Ref Range   Sodium 135 135 - 145 mmol/L   Potassium 4.1 3.5 - 5.1 mmol/L   Chloride 106 101 - 111 mmol/L   CO2 19 (L) 22 - 32 mmol/L   Glucose, Bld 80 65 - 99 mg/dL   BUN 12 6 - 20 mg/dL   Creatinine, Ser 0.61 0.44 - 1.00 mg/dL   Calcium 9.4 8.9 - 10.3 mg/dL   Total Protein 6.5 6.5 - 8.1 g/dL   Albumin 3.2 (L) 3.5 - 5.0 g/dL   AST 25 15 - 41 U/L   ALT 18 14 - 54 U/L   Alkaline Phosphatase 91 38 - 126 U/L   Total Bilirubin 0.4 0.3 - 1.2 mg/dL   GFR calc non Af Amer >60 >60 mL/min   GFR calc Af Amer >60 >60 mL/min   Anion gap 10 5 - 15   MAU Course  Procedures  MDM Labs ordered and reviewed. NST non-reactive with ?late and variable decel, BPP ordered and limited US for placenta. BPP 8/8, AFI 11cm, no abruption seen. Labs and BPs consistent with pre-e. Dr. Matthew Saras notified of presentation, clinical findings, and labs. Plan for admit.  Assessment and Plan  [redacted] weeks gestation Preeclampsia Admit to  [facility]  per Dr. Althea Charon, CNM 03/27/2017, 4:59 PM

## 2017-03-27 NOTE — H&P (Signed)
Brandi Hardy is a 36 y.o. female presenting for minimal spotting>>presented to MAU where BP consistentl>14/90 with normal LFT and PLTS, but elevated Pr/Cr ratio   0.36  Also signif LE edema, decision made to adm for IOL. OB History    Gravida Para Term Preterm AB Living   2 0 0 0 1 0   SAB TAB Ectopic Multiple Live Births   1 0 0 0       Past Medical History:  Diagnosis Date  . Fibroid   . GERD (gastroesophageal reflux disease)   . Malaria 1988   Past Surgical History:  Procedure Laterality Date  . NO PAST SURGERIES     Family History: family history includes Parkinson's disease in her father. Social History:  reports that  has never smoked. she has never used smokeless tobacco. She reports that she drinks alcohol. She reports that she does not use drugs.     Maternal Diabetes: No Genetic Screening: Normal Maternal Ultrasounds/Referrals: Normal Fetal Ultrasounds or other Referrals:  None Maternal Substance Abuse:  No Significant Maternal Medications:  None Significant Maternal Lab Results:  None Other Comments:  None  ROS History Dilation: Closed Effacement (%): Thick Exam by:: Erasmo Score RNC Blood pressure (!) 149/86, pulse 77, temperature 97.6 F (36.4 C), temperature source Oral, resp. rate 16, last menstrual period 07/05/2016, SpO2 100 %. Exam Physical Exam  Constitutional: She is oriented to person, place, and time. She appears well-developed and well-nourished.  HENT:  Head: Normocephalic and atraumatic.  Neck: Normal range of motion. Neck supple.  Cardiovascular: Normal rate and regular rhythm.  Respiratory: Effort normal.  GI:  Term FH FHR 142  Genitourinary:  Genitourinary Comments: VTX, CX closed  Musculoskeletal: Normal range of motion.  Neurological: She is alert and oriented to person, place, and time.    Prenatal labs: ABO, Rh:   Antibody:   Rubella:   RPR:    HBsAg:    HIV:    GBS:     Assessment/Plan: [redacted]w[redacted]d Pre  E  Adm for Cytotec/pitocin IOL   Brandi Hardy 03/27/2017, 5:04 PM

## 2017-03-27 NOTE — Anesthesia Pain Management Evaluation Note (Signed)
  CRNA Pain Management Visit Note  Patient: Brandi Hardy, 36 y.o., female  "Hello I am a member of the anesthesia team at [facility]. We have an anesthesia team available at all times to provide care throughout the hospital, including epidural management and anesthesia for C-section. I don't know your plan for the delivery whether it a natural birth, water birth, IV sedation, nitrous supplementation, doula or epidural, but we want to meet your pain goals."   1.Was your pain managed to your expectations on prior hospitalizations?   Yes   2.What is your expectation for pain management during this hospitalization?     Labor support without medications and Epidural  3.How can we help you reach that goal? Possible epidural  Record the patient's initial score and the patient's pain goal.   Pain: 0  Pain Goal: 6 [facility] wants you to be able to say your pain was always managed very well.  Jazlin Tapscott 03/27/2017

## 2017-03-27 NOTE — MAU Note (Signed)
Pt reports she started having some vaginal bleeding this morning. Also c/o pelvic pressure. Good fetal movement reported.

## 2017-03-28 ENCOUNTER — Encounter (HOSPITAL_COMMUNITY): Payer: Self-pay | Admitting: *Deleted

## 2017-03-28 ENCOUNTER — Encounter (HOSPITAL_COMMUNITY)
Admission: AD | Disposition: A | Discharge status: Home / Self Care | Payer: Self-pay | Source: Ambulatory Visit | Attending: Obstetrics and Gynecology

## 2017-03-28 ENCOUNTER — Inpatient Hospital Stay (HOSPITAL_COMMUNITY): Payer: 59 | Admitting: Anesthesiology

## 2017-03-28 DIAGNOSIS — Z98891 History of uterine scar from previous surgery: Secondary | ICD-10-CM

## 2017-03-28 LAB — RPR: RPR Ser Ql: NONREACTIVE

## 2017-03-28 LAB — CBC
HEMATOCRIT: 36.6 % (ref 36.0–46.0)
Hemoglobin: 11.9 g/dL — ABNORMAL LOW (ref 12.0–15.0)
MCH: 28.4 pg (ref 26.0–34.0)
MCHC: 32.5 g/dL (ref 30.0–36.0)
MCV: 87.4 fL (ref 78.0–100.0)
PLATELETS: 186 10*3/uL (ref 150–400)
RBC: 4.19 MIL/uL (ref 3.87–5.11)
RDW: 13.9 % (ref 11.5–15.5)
WBC: 10.2 10*3/uL (ref 4.0–10.5)

## 2017-03-28 SURGERY — Surgical Case
Anesthesia: Epidural | Wound class: Clean Contaminated

## 2017-03-28 MED ORDER — NALOXONE HCL 0.4 MG/ML IJ SOLN
1.0000 ug/kg/h | INTRAVENOUS | Status: DC | PRN
  Filled 2017-03-28: qty 5

## 2017-03-28 MED ORDER — PRENATAL MULTIVITAMIN CH
1.0000 | ORAL_TABLET | Freq: Every day | ORAL | Status: DC
  Administered 2017-03-29 – 2017-03-30 (×2): 1 via ORAL
  Filled 2017-03-28 (×2): qty 1

## 2017-03-28 MED ORDER — SODIUM CHLORIDE 0.9% FLUSH: 3.0000 mL | INTRAVENOUS | Status: DC | PRN

## 2017-03-28 MED ORDER — SIMETHICONE 80 MG PO CHEW: 80.0000 mg | CHEWABLE_TABLET | ORAL | Status: DC | PRN

## 2017-03-28 MED ORDER — PHENYLEPHRINE 40 MCG/ML (10ML) SYRINGE FOR IV PUSH (FOR BLOOD PRESSURE SUPPORT): 80.0000 ug | PREFILLED_SYRINGE | INTRAVENOUS | Status: DC | PRN

## 2017-03-28 MED ORDER — OXYTOCIN 40 UNITS IN LACTATED RINGERS INFUSION - SIMPLE MED: 2.5000 [IU]/h | INTRAVENOUS | Status: AC

## 2017-03-28 MED ORDER — OXYTOCIN 40 UNITS IN LACTATED RINGERS INFUSION - SIMPLE MED
1.0000 m[IU]/min | INTRAVENOUS | Status: DC
Start: 2017-03-28 — End: 2017-03-28
  Administered 2017-03-28: 2 m[IU]/min via INTRAVENOUS
  Filled 2017-03-28: qty 1000

## 2017-03-28 MED ORDER — SODIUM BICARBONATE 8.4 % IV SOLN
INTRAVENOUS | Status: AC
  Filled 2017-03-28: qty 50

## 2017-03-28 MED ORDER — EPHEDRINE 5 MG/ML INJ: 10.0000 mg | INTRAVENOUS | Status: DC | PRN

## 2017-03-28 MED ORDER — ONDANSETRON HCL 4 MG/2ML IJ SOLN: 4.0000 mg | Freq: Four times a day (QID) | INTRAMUSCULAR | Status: DC | PRN

## 2017-03-28 MED ORDER — LACTATED RINGERS IV SOLN
INTRAVENOUS | Status: DC | PRN
  Administered 2017-03-28: 12:00:00 via INTRAVENOUS

## 2017-03-28 MED ORDER — NALBUPHINE HCL 10 MG/ML IJ SOLN: 5.0000 mg | INTRAMUSCULAR | Status: DC | PRN

## 2017-03-28 MED ORDER — SODIUM CHLORIDE 0.9 % IR SOLN
Status: DC | PRN
  Administered 2017-03-28: 1000 mL

## 2017-03-28 MED ORDER — OXYCODONE-ACETAMINOPHEN 5-325 MG PO TABS
1.0000 | ORAL_TABLET | ORAL | Status: DC | PRN
  Administered 2017-03-30: 1 via ORAL
  Filled 2017-03-28: qty 1

## 2017-03-28 MED ORDER — DEXAMETHASONE SODIUM PHOSPHATE 4 MG/ML IJ SOLN
INTRAMUSCULAR | Status: DC | PRN
  Administered 2017-03-28: 4 mg via INTRAVENOUS

## 2017-03-28 MED ORDER — WITCH HAZEL-GLYCERIN EX PADS: 1.0000 "application " | MEDICATED_PAD | CUTANEOUS | Status: DC | PRN

## 2017-03-28 MED ORDER — KETOROLAC TROMETHAMINE 30 MG/ML IJ SOLN: 30.0000 mg | Freq: Four times a day (QID) | INTRAMUSCULAR | Status: AC | PRN

## 2017-03-28 MED ORDER — OXYCODONE HCL 5 MG PO TABS: 5.0000 mg | ORAL_TABLET | Freq: Once | ORAL | Status: DC | PRN

## 2017-03-28 MED ORDER — SENNOSIDES-DOCUSATE SODIUM 8.6-50 MG PO TABS
2.0000 | ORAL_TABLET | ORAL | Status: DC
  Administered 2017-03-28 – 2017-03-31 (×3): 2 via ORAL
  Filled 2017-03-28 (×4): qty 2

## 2017-03-28 MED ORDER — ZOLPIDEM TARTRATE 5 MG PO TABS: 5.0000 mg | ORAL_TABLET | Freq: Every evening | ORAL | Status: DC | PRN

## 2017-03-28 MED ORDER — PHENYLEPHRINE 40 MCG/ML (10ML) SYRINGE FOR IV PUSH (FOR BLOOD PRESSURE SUPPORT)
80.0000 ug | PREFILLED_SYRINGE | INTRAVENOUS | Status: DC | PRN
  Administered 2017-03-28 (×2): 80 ug via INTRAVENOUS

## 2017-03-28 MED ORDER — PROMETHAZINE HCL 25 MG/ML IJ SOLN
12.5000 mg | Freq: Four times a day (QID) | INTRAMUSCULAR | Status: DC | PRN
  Administered 2017-03-28: 12.5 mg via INTRAVENOUS
  Filled 2017-03-28: qty 1

## 2017-03-28 MED ORDER — LACTATED RINGERS IV SOLN: 500.0000 mL | Freq: Once | INTRAVENOUS | Status: DC

## 2017-03-28 MED ORDER — KETOROLAC TROMETHAMINE 30 MG/ML IJ SOLN
INTRAMUSCULAR | Status: AC
  Administered 2017-03-28: 30 mg via INTRAMUSCULAR
  Filled 2017-03-28: qty 1

## 2017-03-28 MED ORDER — MORPHINE SULFATE (PF) 0.5 MG/ML IJ SOLN
INTRAMUSCULAR | Status: AC
  Filled 2017-03-28: qty 10

## 2017-03-28 MED ORDER — OXYTOCIN 10 UNIT/ML IJ SOLN
INTRAMUSCULAR | Status: AC
  Filled 2017-03-28: qty 4

## 2017-03-28 MED ORDER — MEPERIDINE HCL 25 MG/ML IJ SOLN: 6.2500 mg | INTRAMUSCULAR | Status: DC | PRN

## 2017-03-28 MED ORDER — ONDANSETRON HCL 4 MG/2ML IJ SOLN
INTRAMUSCULAR | Status: AC
  Filled 2017-03-28: qty 4

## 2017-03-28 MED ORDER — MORPHINE SULFATE (PF) 0.5 MG/ML IJ SOLN
INTRAMUSCULAR | Status: DC | PRN
  Administered 2017-03-28: 4 mg via EPIDURAL

## 2017-03-28 MED ORDER — SCOPOLAMINE 1 MG/3DAYS TD PT72
MEDICATED_PATCH | TRANSDERMAL | Status: DC | PRN
  Administered 2017-03-28: 1 via TRANSDERMAL

## 2017-03-28 MED ORDER — ONDANSETRON HCL 4 MG/2ML IJ SOLN
INTRAMUSCULAR | Status: DC | PRN
  Administered 2017-03-28: 4 mg via INTRAVENOUS

## 2017-03-28 MED ORDER — NALBUPHINE HCL 10 MG/ML IJ SOLN: 5.0000 mg | Freq: Once | INTRAMUSCULAR | Status: DC | PRN

## 2017-03-28 MED ORDER — NALOXONE HCL 0.4 MG/ML IJ SOLN: 0.4000 mg | INTRAMUSCULAR | Status: DC | PRN

## 2017-03-28 MED ORDER — SIMETHICONE 80 MG PO CHEW
80.0000 mg | CHEWABLE_TABLET | Freq: Three times a day (TID) | ORAL | Status: DC
  Administered 2017-03-29 – 2017-03-30 (×5): 80 mg via ORAL
  Filled 2017-03-28 (×4): qty 1

## 2017-03-28 MED ORDER — FENTANYL 2.5 MCG/ML BUPIVACAINE 1/10 % EPIDURAL INFUSION (WH - ANES)
14.0000 mL/h | INTRAMUSCULAR | Status: DC | PRN
  Administered 2017-03-28: 14 mL/h via EPIDURAL
  Filled 2017-03-28: qty 100

## 2017-03-28 MED ORDER — SIMETHICONE 80 MG PO CHEW
80.0000 mg | CHEWABLE_TABLET | ORAL | Status: DC
  Administered 2017-03-28 – 2017-03-31 (×2): 80 mg via ORAL
  Filled 2017-03-28 (×3): qty 1

## 2017-03-28 MED ORDER — DIPHENHYDRAMINE HCL 50 MG/ML IJ SOLN: 12.5000 mg | INTRAMUSCULAR | Status: DC | PRN

## 2017-03-28 MED ORDER — BUPIVACAINE HCL (PF) 0.25 % IJ SOLN
INTRAMUSCULAR | Status: AC
  Filled 2017-03-28: qty 20

## 2017-03-28 MED ORDER — COCONUT OIL OIL: 1.0000 "application " | TOPICAL_OIL | Status: DC | PRN

## 2017-03-28 MED ORDER — LIDOCAINE-EPINEPHRINE (PF) 2 %-1:200000 IJ SOLN
INTRAMUSCULAR | Status: AC
  Filled 2017-03-28: qty 20

## 2017-03-28 MED ORDER — DIPHENHYDRAMINE HCL 25 MG PO CAPS: 25.0000 mg | ORAL_CAPSULE | Freq: Four times a day (QID) | ORAL | Status: DC | PRN

## 2017-03-28 MED ORDER — KETOROLAC TROMETHAMINE 30 MG/ML IJ SOLN
30.0000 mg | Freq: Four times a day (QID) | INTRAMUSCULAR | Status: AC | PRN
  Administered 2017-03-28: 30 mg via INTRAMUSCULAR

## 2017-03-28 MED ORDER — FENTANYL CITRATE (PF) 100 MCG/2ML IJ SOLN: 25.0000 ug | INTRAMUSCULAR | Status: DC | PRN

## 2017-03-28 MED ORDER — PHENYLEPHRINE 40 MCG/ML (10ML) SYRINGE FOR IV PUSH (FOR BLOOD PRESSURE SUPPORT)
80.0000 ug | PREFILLED_SYRINGE | INTRAVENOUS | Status: DC | PRN
  Filled 2017-03-28: qty 10

## 2017-03-28 MED ORDER — DIBUCAINE 1 % RE OINT: 1.0000 "application " | TOPICAL_OINTMENT | RECTAL | Status: DC | PRN

## 2017-03-28 MED ORDER — ACETAMINOPHEN 325 MG PO TABS
650.0000 mg | ORAL_TABLET | ORAL | Status: DC | PRN
  Administered 2017-03-29: 650 mg via ORAL
  Filled 2017-03-28: qty 2

## 2017-03-28 MED ORDER — TETANUS-DIPHTH-ACELL PERTUSSIS 5-2.5-18.5 LF-MCG/0.5 IM SUSP: 0.5000 mL | Freq: Once | INTRAMUSCULAR | Status: DC

## 2017-03-28 MED ORDER — CEFAZOLIN SODIUM-DEXTROSE 2-3 GM-%(50ML) IV SOLR
INTRAVENOUS | Status: AC
  Filled 2017-03-28: qty 50

## 2017-03-28 MED ORDER — ONDANSETRON HCL 4 MG/2ML IJ SOLN: 4.0000 mg | Freq: Three times a day (TID) | INTRAMUSCULAR | Status: DC | PRN

## 2017-03-28 MED ORDER — OXYCODONE HCL 5 MG/5ML PO SOLN: 5.0000 mg | Freq: Once | ORAL | Status: DC | PRN

## 2017-03-28 MED ORDER — IBUPROFEN 600 MG PO TABS
600.0000 mg | ORAL_TABLET | Freq: Four times a day (QID) | ORAL | Status: DC
  Administered 2017-03-28 – 2017-03-31 (×10): 600 mg via ORAL
  Filled 2017-03-28 (×10): qty 1

## 2017-03-28 MED ORDER — OXYCODONE-ACETAMINOPHEN 5-325 MG PO TABS: 2.0000 | ORAL_TABLET | ORAL | Status: DC | PRN

## 2017-03-28 MED ORDER — DEXAMETHASONE SODIUM PHOSPHATE 4 MG/ML IJ SOLN
INTRAMUSCULAR | Status: AC
  Filled 2017-03-28: qty 1

## 2017-03-28 MED ORDER — SCOPOLAMINE 1 MG/3DAYS TD PT72: 1.0000 | MEDICATED_PATCH | Freq: Once | TRANSDERMAL | Status: DC

## 2017-03-28 MED ORDER — SODIUM BICARBONATE 8.4 % IV SOLN
INTRAVENOUS | Status: DC | PRN
  Administered 2017-03-28 (×2): 5 mL via EPIDURAL

## 2017-03-28 MED ORDER — DIPHENHYDRAMINE HCL 25 MG PO CAPS
25.0000 mg | ORAL_CAPSULE | ORAL | Status: DC | PRN
  Filled 2017-03-28: qty 1

## 2017-03-28 MED ORDER — LACTATED RINGERS IV SOLN: INTRAVENOUS | Status: DC

## 2017-03-28 MED ORDER — LIDOCAINE HCL (PF) 1 % IJ SOLN
INTRAMUSCULAR | Status: DC | PRN
  Administered 2017-03-28 (×2): 5 mL via EPIDURAL

## 2017-03-28 MED ORDER — MENTHOL 3 MG MT LOZG: 1.0000 | LOZENGE | OROMUCOSAL | Status: DC | PRN

## 2017-03-28 MED ORDER — OXYTOCIN 10 UNIT/ML IJ SOLN
INTRAVENOUS | Status: DC | PRN
  Administered 2017-03-28: 40 [IU] via INTRAVENOUS

## 2017-03-28 MED ORDER — CEFAZOLIN SODIUM-DEXTROSE 2-3 GM-%(50ML) IV SOLR
INTRAVENOUS | Status: DC | PRN
  Administered 2017-03-28: 2 g via INTRAVENOUS

## 2017-03-28 MED ORDER — BUTORPHANOL TARTRATE 1 MG/ML IJ SOLN
1.0000 mg | Freq: Once | INTRAMUSCULAR | Status: AC
  Administered 2017-03-28: 1 mg via INTRAVENOUS
  Filled 2017-03-28: qty 1

## 2017-03-28 MED ORDER — BUPIVACAINE HCL (PF) 0.25 % IJ SOLN
INTRAMUSCULAR | Status: AC
  Filled 2017-03-28: qty 10

## 2017-03-28 MED ORDER — SCOPOLAMINE 1 MG/3DAYS TD PT72
MEDICATED_PATCH | TRANSDERMAL | Status: AC
  Filled 2017-03-28: qty 1

## 2017-03-28 MED ORDER — BUPIVACAINE HCL (PF) 0.25 % IJ SOLN
INTRAMUSCULAR | Status: DC | PRN
  Administered 2017-03-28: 10 mL

## 2017-03-28 SURGICAL SUPPLY — 34 items
BARRIER ADHS 3X4 INTERCEED (GAUZE/BANDAGES/DRESSINGS) IMPLANT
BENZOIN TINCTURE PRP APPL 2/3 (GAUZE/BANDAGES/DRESSINGS) ×3 IMPLANT
CHLORAPREP W/TINT 26ML (MISCELLANEOUS) ×3 IMPLANT
CLAMP CORD UMBIL (MISCELLANEOUS) IMPLANT
CLOSURE WOUND 1/2 X4 (GAUZE/BANDAGES/DRESSINGS) ×1
CLOTH BEACON ORANGE TIMEOUT ST (SAFETY) ×3 IMPLANT
CONTAINER PREFILL 10% NBF 15ML (MISCELLANEOUS) IMPLANT
DRSG OPSITE POSTOP 4X10 (GAUZE/BANDAGES/DRESSINGS) ×3 IMPLANT
ELECT REM PT RETURN 9FT ADLT (ELECTROSURGICAL) ×3
ELECTRODE REM PT RTRN 9FT ADLT (ELECTROSURGICAL) ×1 IMPLANT
EXTRACTOR VACUUM M CUP 4 TUBE (SUCTIONS) ×2 IMPLANT
EXTRACTOR VACUUM M CUP 4' TUBE (SUCTIONS) ×1
GLOVE BIO SURGEON STRL SZ 6.5 (GLOVE) ×2 IMPLANT
GLOVE BIO SURGEONS STRL SZ 6.5 (GLOVE) ×1
GLOVE BIOGEL PI IND STRL 7.0 (GLOVE) ×1 IMPLANT
GLOVE BIOGEL PI INDICATOR 7.0 (GLOVE) ×2
GOWN STRL REUS W/TWL LRG LVL3 (GOWN DISPOSABLE) ×6 IMPLANT
KIT ABG SYR 3ML LUER SLIP (SYRINGE) IMPLANT
NEEDLE HYPO 22GX1.5 SAFETY (NEEDLE) IMPLANT
NEEDLE HYPO 25X5/8 SAFETYGLIDE (NEEDLE) ×3 IMPLANT
NS IRRIG 1000ML POUR BTL (IV SOLUTION) ×3 IMPLANT
PACK C SECTION WH (CUSTOM PROCEDURE TRAY) ×3 IMPLANT
PAD OB MATERNITY 4.3X12.25 (PERSONAL CARE ITEMS) ×3 IMPLANT
PENCIL SMOKE EVAC W/HOLSTER (ELECTROSURGICAL) ×3 IMPLANT
STRIP CLOSURE SKIN 1/2X4 (GAUZE/BANDAGES/DRESSINGS) ×2 IMPLANT
SUT CHROMIC 0 CTX 36 (SUTURE) ×6 IMPLANT
SUT PLAIN 0 NONE (SUTURE) IMPLANT
SUT PLAIN 2 0 XLH (SUTURE) IMPLANT
SUT VIC AB 0 CT1 27 (SUTURE) ×6
SUT VIC AB 0 CT1 27XBRD ANBCTR (SUTURE) ×3 IMPLANT
SUT VIC AB 4-0 KS 27 (SUTURE) IMPLANT
SYR CONTROL 10ML LL (SYRINGE) IMPLANT
TOWEL OR 17X24 6PK STRL BLUE (TOWEL DISPOSABLE) ×3 IMPLANT
TRAY FOLEY BAG SILVER LF 14FR (SET/KITS/TRAYS/PACK) ×3 IMPLANT

## 2017-03-28 NOTE — Progress Notes (Signed)
Verbal order rec to begin pitocin at 19mu and increase by 58mu per protocol if no cervical change upon next exam after epidural is placed.

## 2017-03-28 NOTE — Progress Notes (Signed)
CBC results obtained. Anesthesia phoned

## 2017-03-28 NOTE — Anesthesia Procedure Notes (Signed)
Epidural Patient location during procedure: OB Start time: 03/28/2017 8:41 AM End time: 03/28/2017 8:30 AM  Staffing Anesthesiologist: Albertha Ghee, MD Performed: anesthesiologist   Preanesthetic Checklist Completed: patient identified, site marked, pre-op evaluation, timeout performed, IV checked, risks and benefits discussed and monitors and equipment checked  Epidural Patient position: sitting Prep: DuraPrep Patient monitoring: heart rate, cardiac monitor, continuous pulse ox and blood pressure Approach: midline Location: L2-L3 Injection technique: LOR saline  Needle:  Needle type: Tuohy  Needle gauge: 17 G Needle length: 9 cm Needle insertion depth: 7 cm Catheter type: closed end flexible Catheter size: 19 Gauge Catheter at skin depth: 12 cm Test dose: negative and Other  Assessment Events: blood not aspirated, injection not painful, no injection resistance and negative IV test  Additional Notes Informed consent obtained prior to proceeding including risk of failure, 1% risk of PDPH, risk of minor discomfort and bruising.  Discussed rare but serious complications including epidural abscess, permanent nerve injury, epidural hematoma.  Discussed alternatives to epidural analgesia and patient desires to proceed.  Timeout performed pre-procedure verifying patient name, procedure, and platelet count.  Patient tolerated procedure well. Reason for block:procedure for pain

## 2017-03-28 NOTE — Progress Notes (Signed)
Called to bedside For repetitive late decelerations FHR repetitive late decelerations  Cervix still 2 cm dilated Recommend Primary LTCS Risks reviewed Consent signed

## 2017-03-28 NOTE — Transfer of Care (Signed)
Immediate Anesthesia Transfer of Care Note  Patient: Brandi Hardy  Procedure(s) Performed: CESAREAN SECTION (N/A )  Patient Location: PACU  Anesthesia Type:Epidural  Level of Consciousness: awake, alert  and oriented  Airway & Oxygen Therapy: Patient Spontanous Breathing  Post-op Assessment: Report given to RN and Post -op Vital signs reviewed and stable  Post vital signs: Reviewed and stable BP 93/59, HR 84, RR 16, SaO2 98%  Last Vitals:  Vitals:   03/28/17 1100 03/28/17 1130  BP: 120/74 121/71  Pulse: 90 83  Resp:    Temp:    SpO2:      Last Pain:  Vitals:   03/28/17 1105  TempSrc:   PainSc: Asleep      Patients Stated Pain Goal: 0 (54/27/06 2376)  Complications: No apparent anesthesia complications

## 2017-03-28 NOTE — Progress Notes (Signed)
Patient is starting to feel uncomfortable.  BP 138/84   Pulse 86   Temp 98.5 F (36.9 C) (Oral)   Resp 18   Ht 5\' 7"  (1.702 m)   Wt 98.9 kg (218 lb)   LMP 07/05/2016   SpO2 100%   BMI 34.14 kg/m  FHR Category 1 Toco Contractions q 2 minutes  Cervix is 80% 2 cm -2  Vertex AROM Trace Meconium  Plan for Epidural Pitocin prn Antibiotics for GBBS + Follow labor curve and monitor BPs Closely

## 2017-03-28 NOTE — Brief Op Note (Signed)
03/28/2017  12:32 PM  PATIENT:  Brandi Hardy  36 y.o. female  PRE-OPERATIVE DIAGNOSIS:  IUP at [redacted] weeks Gestational hypertension Fetal intolerance to labor  POST-OPERATIVE DIAGNOSIS:   Same Fibroids  PROCEDURE:  Procedure(s): CESAREAN SECTION (N/A)  SURGEON:  Surgeon(s) and Role:    * Dian Queen, MD - Primary  PHYSICIAN ASSISTANT:   ASSISTANTS: none   ANESTHESIA:   epidural  EBL:  745 mL   BLOOD ADMINISTERED:none  DRAINS: Urinary Catheter (Foley)   LOCAL MEDICATIONS USED:  NONE  SPECIMEN:  Source of Specimen:  placenta  DISPOSITION OF SPECIMEN:  PATHOLOGY  COUNTS:  YES  TOURNIQUET:  * No tourniquets in log *  DICTATION: .Other Dictation: Dictation Number [id number]  PLAN OF CARE: Admit to inpatient   PATIENT DISPOSITION:  PACU - hemodynamically stable.   Delay start of Pharmacological VTE agent (>24hrs) due to surgical blood loss or risk of bleeding: not applicable

## 2017-03-28 NOTE — Anesthesia Preprocedure Evaluation (Signed)
Anesthesia Evaluation  Patient identified by MRN, date of birth, ID band Patient awake    Reviewed: Allergy & Precautions, H&P , NPO status , Patient's Chart, lab work & pertinent test results  Airway Mallampati: I   Neck ROM: full    Dental   Pulmonary neg pulmonary ROS,    breath sounds clear to auscultation       Cardiovascular hypertension,  Rhythm:regular Rate:Normal     Neuro/Psych    GI/Hepatic GERD  ,  Endo/Other    Renal/GU      Musculoskeletal   Abdominal   Peds  Hematology   Anesthesia Other Findings   Reproductive/Obstetrics (+) Pregnancy                             Anesthesia Physical Anesthesia Plan  ASA: II  Anesthesia Plan: Epidural   Post-op Pain Management:    Induction: Intravenous  PONV Risk Score and Plan: 2 and Treatment may vary due to age or medical condition  Airway Management Planned: Natural Airway  Additional Equipment:   Intra-op Plan:   Post-operative Plan:   Informed Consent: I have reviewed the patients History and Physical, chart, labs and discussed the procedure including the risks, benefits and alternatives for the proposed anesthesia with the patient or authorized representative who has indicated his/her understanding and acceptance.     Plan Discussed with: Anesthesiologist  Anesthesia Plan Comments:         Anesthesia Quick Evaluation

## 2017-03-28 NOTE — Progress Notes (Signed)
This note also relates to the following rows which could not be included: Pulse Rate - Cannot attach notes to unvalidated device data  Pt in middle of contraction and getting epidural placed

## 2017-03-28 NOTE — Lactation Note (Signed)
This note was copied from a baby's chart. Lactation Consultation Note  Patient Name: Brandi Hardy [id number] Date: 03/28/2017 Reason for consult: Initial assessment;Primapara;Early term 37-38.6wks;1st time breastfeeding  Visited with 1st time Mom, baby 14 hrs old.  Baby has low CBG 25, and baby given supplement with formula, and now blood sugar >50. Mom had baby in laid back position.  Mom shown how to sandwich breast to encouraged a deep latch.  Baby opens widely and multiple swallows identified for Mom.   Basics reviewed.  Encouraged STS and cue based feedings, with goal of 8-12 feedings per 24 hrs.   Lactation brochure left with Mom.  Mom aware of OP lactation support available to her.    Consult Status Consult Status: Follow-up Date: 03/29/17 Follow-up type: In-patient    Broadus John 03/28/2017, 7:11 PM

## 2017-03-29 ENCOUNTER — Encounter (HOSPITAL_COMMUNITY): Payer: Self-pay | Admitting: Obstetrics and Gynecology

## 2017-03-29 LAB — CBC
HCT: 25 % — ABNORMAL LOW (ref 36.0–46.0)
Hemoglobin: 8.3 g/dL — ABNORMAL LOW (ref 12.0–15.0)
MCH: 28.9 pg (ref 26.0–34.0)
MCHC: 33.2 g/dL (ref 30.0–36.0)
MCV: 87.1 fL (ref 78.0–100.0)
Platelets: 121 10*3/uL — ABNORMAL LOW (ref 150–400)
RBC: 2.87 MIL/uL — ABNORMAL LOW (ref 3.87–5.11)
RDW: 14 % (ref 11.5–15.5)
WBC: 10.8 10*3/uL — ABNORMAL HIGH (ref 4.0–10.5)

## 2017-03-29 NOTE — Anesthesia Postprocedure Evaluation (Signed)
Anesthesia Post Note  Patient: Brandi Hardy  Procedure(s) Performed: CESAREAN SECTION (N/A )     Patient location during evaluation: Mother Baby Anesthesia Type: Epidural Level of consciousness: awake and alert, oriented and patient cooperative Pain management: pain level controlled Vital Signs Assessment: post-procedure vital signs reviewed and stable Respiratory status: spontaneous breathing Cardiovascular status: stable Postop Assessment: no headache, epidural receding, patient able to bend at knees and no signs of nausea or vomiting Anesthetic complications: no Comments: Pain score 3.    Last Vitals:  Vitals:   03/29/17 0330 03/29/17 0555  BP:  116/68  Pulse:  82  Resp:  18  Temp:  37.2 C  SpO2: 99% 97%    Last Pain:  Vitals:   03/29/17 0555  TempSrc: Oral  PainSc:    Pain Goal: Patients Stated Pain Goal: 0 (03/27/17 1250)               Rico Sheehan

## 2017-03-29 NOTE — Op Note (Deleted)
  The note originally documented on this encounter has been moved the the encounter in which it belongs.  

## 2017-03-29 NOTE — Progress Notes (Signed)
Doing well No complaints No pain.  BP 116/68 (BP Location: Right Arm)   Pulse 82   Temp 99 F (37.2 C) (Oral)   Resp 18   Ht 5\' 7"  (1.702 m)   Wt 98.9 kg (218 lb)   LMP 07/05/2016   SpO2 97%   Breastfeeding? Unknown   BMI 34.14 kg/m  Results for orders placed or performed during the hospital encounter of 03/27/17 (from the past 24 hour(s))  CBC     Status: Abnormal   Collection Time: 03/29/17  5:24 AM  Result Value Ref Range   WBC 10.8 (H) 4.0 - 10.5 K/uL   RBC 2.87 (L) 3.87 - 5.11 MIL/uL   Hemoglobin 8.3 (L) 12.0 - 15.0 g/dL   HCT 25.0 (L) 36.0 - 46.0 %   MCV 87.1 78.0 - 100.0 fL   MCH 28.9 26.0 - 34.0 pg   MCHC 33.2 30.0 - 36.0 g/dL   RDW 14.0 11.5 - 15.5 %   Platelets 121 (L) 150 - 400 K/uL   Abdomen is soft and non tender  POD # 1  Doing well Routine care Discharge home tomorrow

## 2017-03-29 NOTE — Op Note (Signed)
Brandi Hardy, Brandi Hardy NO.:  [id number]  MEDICAL RECORD NO.:  [id number]  LOCATION:                                FACILITY:  [facility]  PHYSICIAN:  Arpi Diebold L. Neysha Criado, M.D.DATE OF BIRTH:  December 02, 1980  DATE OF PROCEDURE:  03/28/2017 DATE OF DISCHARGE:                              OPERATIVE REPORT   PREOPERATIVE DIAGNOSES: 1. Intrauterine pregnancy at 38 weeks. 2. Gestational hypertension. 3. Fetal intolerance to labor.  POSTOPERATIVE DIAGNOSES: 1. Intrauterine pregnancy at 38 weeks. 2. Gestational hypertension. 3. Fetal intolerance to labor. 4. Nuchal cord. 5. Fibroids.  PROCEDURE:  Primary low transverse cesarean section.  SURGEON:  Kaprice Kage L. Helane Rima, M.D.  ANESTHESIA:  Epidural.  ESTIMATED BLOOD LOSS:  745 mL.  DRAINS:  Foley.  PATHOLOGY:  Placenta.  DESCRIPTION OF PROCEDURE:  The patient was taken to the operating room. She was then prepped and draped.  A low transverse incision was made, carried down to the fascia.  Fascia scored in the midline and extended laterally.  Rectus muscles were separated in the midline.  The peritoneum was entered bluntly.  The peritoneal incision was stretched. The bladder blade was inserted.  The lower uterine segment was identified and a bladder flap was created sharply and then digitally and the bladder blade was then readjusted.  A low transverse incision was made in the uterus.  The uterus was entered using hemostat.  The baby was in cephalic presentation, was delivered easily with the vacuum.  The baby was delivered easily.  The cord was clamped and cut.  There was noted to be a tight nuchal cord.  Of course, the baby was vigorous on the abdomen and was a female.  The delayed cord clamping was performed. The placenta was manually removed, noted to be normal, intact with a three-vessel cord.  The uterus was cleared of all clots and debris.  It was enlarged because there were fibroids, but when I looked inside  of the cavity, I did not feel any large fibroids inside.  However, she had several large intramural fibroids.  We could not exteriorize the uterus. I closed the uterine incision using 0 chromic in a running lock stitch. Irrigation was performed.  Hemostasis was very good.  The peritoneum was closed using 0 Vicryl.  The fascia was closed using 0 Vicryl starting at each corner and meeting in the midline.  After irrigation of the subcutaneous layer, the skin was closed with a 4-0 Vicryl on a Keith needle.  All sponge, lap, and instrument counts were correct x2.  Benzoin and Steri-Strips and a honeycomb dressing were applied.     Gianmarco Roye L. Helane Rima, M.D.   ______________________________ Erik Obey. Helane Rima, M.D.    Nevin Bloodgood  D:  03/28/2017  T:  03/29/2017  Job:  [id number]

## 2017-03-30 NOTE — Progress Notes (Signed)
Patient doing well. Complains of some mild incisional pain  BP 140/86 (BP Location: Right Arm)   Pulse 90   Temp 98.1 F (36.7 C) (Oral)   Resp 18   Ht 5\' 7"  (1.702 m)   Wt 98.9 kg (218 lb)   LMP 07/05/2016   SpO2 98%   Breastfeeding? Unknown   BMI 34.14 kg/m  Abdomen is soft and non tender  Bandage clean and dry  POD # 2 doing well Routine care Discharge home tomorrow

## 2017-03-31 MED ORDER — IBUPROFEN 600 MG PO TABS: 600.0000 mg | ORAL_TABLET | Freq: Four times a day (QID) | ORAL | 0 refills | Status: DC

## 2017-03-31 MED ORDER — FERROUS SULFATE 325 (65 FE) MG PO TABS: 325.0000 mg | ORAL_TABLET | Freq: Every day | ORAL | Status: DC

## 2017-03-31 MED ORDER — OXYCODONE-ACETAMINOPHEN 5-325 MG PO TABS: 1.0000 | ORAL_TABLET | ORAL | 0 refills | Status: DC | PRN

## 2017-03-31 MED ORDER — FERROUS SULFATE 325 (65 FE) MG PO TABS: 325.0000 mg | ORAL_TABLET | Freq: Every day | ORAL | 3 refills | Status: DC

## 2017-03-31 NOTE — Lactation Note (Signed)
This note was copied from a baby's chart. Lactation Consultation Note: Mothers breast are firm and full. Staff nurse gave mother ice to apply to breast.  Mother asking for formula. Lots of teaching about supply and demand. Discussed engorgement and need to empty breast to make more milk.  Assist mother with latching infant on the Rt breast in football hold. Infant sustained latch for 20 mins. Parents taught to watch infant for swallows. Observed frequent suckling and audible swallows. Mother taught to use hand pump to pre pump and soften areola tissue.  Infant placed in cross cradle hold and latched to alternate breast. Infant sustained latch for 15 mins.   Advised mother to post pump after each feeding for 15 mins  . Mother has a Spectra pump at home. Advised mother to do good breast massage to keep breast soft and continue to use ice for 15 mins every 3-4 hours. Advised mother to continue to cue base feeding infant. Mother informed that infant needs to breastfeed at least 8-12 times a day. Discussed cluster feeding and frequent skin to skin.  Mother advised to follow up with [facility] services as needed.  Patient Name: Brandi Hardy [id number] Date: 03/31/2017 Reason for consult: Follow-up assessment   Maternal Data    Feeding Feeding Type: Breast Fed Length of feed: 15 min  LATCH Score Latch: Grasps breast easily, tongue down, lips flanged, rhythmical sucking.  Audible Swallowing: Spontaneous and intermittent  Type of Nipple: Everted at rest and after stimulation  Comfort (Breast/Nipple): Filling, red/small blisters or bruises, mild/mod discomfort  Hold (Positioning): Assistance needed to correctly position infant at breast and maintain latch.  LATCH Score: 8  Interventions Interventions: Breast feeding basics reviewed;Assisted with latch;Breast massage;Hand express;Support pillows;Adjust position;DEBP;Position options;Expressed milk  Lactation Tools Discussed/Used Tools: Nipple  Shields Nipple shield size: 20   Consult Status Consult Status: Complete    Darla Lesches 03/31/2017, 9:52 AM

## 2017-03-31 NOTE — Discharge Summary (Signed)
Obstetric Discharge Summary Reason for Admission: induction of labor Prenatal Procedures: ultrasound Intrapartum Procedures: cesarean: low cervical, transverse Postpartum Procedures: none Complications-Operative and Postpartum: none Hemoglobin  Date Value Ref Range Status  03/29/2017 8.3 (L) 12.0 - 15.0 g/dL Final    Comment:    DELTA CHECK NOTED REPEATED TO VERIFY    HCT  Date Value Ref Range Status  03/29/2017 25.0 (L) 36.0 - 46.0 % Final    Physical Exam:  General: alert and cooperative Lochia: appropriate Uterine Fundus: firm Incision: healing well, no significant drainage DVT Evaluation: No evidence of DVT seen on physical exam.  Discharge Diagnoses: Term Pregnancy-delivered  Discharge Information: Date: 03/31/2017 Activity: pelvic rest Diet: routine Medications: PNV, Ibuprofen, Iron and Percocet Condition: stable Instructions: refer to practice specific booklet Discharge to: home   Newborn Data: Live born female  Birth Weight: 7 lb 5.6 oz (3335 g) APGAR: 8, 9  Newborn Delivery   Birth date/time:  03/28/2017 12:06:00 Delivery type:  C-Section, Vacuum Assisted C-section categorization:  Primary     Home with mother.  Marylynn Pearson 03/31/2017, 8:02 AM

## 2017-03-31 NOTE — Progress Notes (Signed)
Mother starting to become engorged. Set up with [facility]. RN taught mother how to hand express and massage breast.

## 2017-04-06 ENCOUNTER — Inpatient Hospital Stay (HOSPITAL_COMMUNITY)
Admission: AD | Admit: 2017-04-06 | Discharge: 2017-04-06 | Disposition: A | Discharge status: Home / Self Care | Payer: 59 | Source: Ambulatory Visit | Attending: Obstetrics and Gynecology | Admitting: Obstetrics and Gynecology

## 2017-04-06 ENCOUNTER — Encounter (HOSPITAL_COMMUNITY): Payer: Self-pay

## 2017-04-06 DIAGNOSIS — O135 Gestational [pregnancy-induced] hypertension without significant proteinuria, complicating the puerperium: Secondary | ICD-10-CM | POA: Diagnosis not present

## 2017-04-06 DIAGNOSIS — T8131XD Disruption of external operation (surgical) wound, not elsewhere classified, subsequent encounter: Secondary | ICD-10-CM | POA: Diagnosis not present

## 2017-04-06 DIAGNOSIS — O165 Unspecified maternal hypertension, complicating the puerperium: Secondary | ICD-10-CM | POA: Diagnosis not present

## 2017-04-06 DIAGNOSIS — Z98891 History of uterine scar from previous surgery: Secondary | ICD-10-CM

## 2017-04-06 DIAGNOSIS — N939 Abnormal uterine and vaginal bleeding, unspecified: Secondary | ICD-10-CM | POA: Diagnosis present

## 2017-04-06 DIAGNOSIS — O900 Disruption of cesarean delivery wound: Secondary | ICD-10-CM | POA: Insufficient documentation

## 2017-04-06 LAB — URINALYSIS, ROUTINE W REFLEX MICROSCOPIC
Bilirubin Urine: NEGATIVE
GLUCOSE, UA: NEGATIVE mg/dL
HGB URINE DIPSTICK: NEGATIVE
KETONES UR: NEGATIVE mg/dL
LEUKOCYTES UA: NEGATIVE
Nitrite: NEGATIVE
PROTEIN: NEGATIVE mg/dL
Specific Gravity, Urine: 1.015 (ref 1.005–1.030)
pH: 6 (ref 5.0–8.0)

## 2017-04-06 LAB — COMPREHENSIVE METABOLIC PANEL
ALT: 59 U/L — ABNORMAL HIGH (ref 14–54)
AST: 26 U/L (ref 15–41)
Albumin: 3.2 g/dL — ABNORMAL LOW (ref 3.5–5.0)
Alkaline Phosphatase: 80 U/L (ref 38–126)
Anion gap: 9 (ref 5–15)
BUN: 15 mg/dL (ref 6–20)
CHLORIDE: 109 mmol/L (ref 101–111)
CO2: 22 mmol/L (ref 22–32)
Calcium: 9 mg/dL (ref 8.9–10.3)
Creatinine, Ser: 0.71 mg/dL (ref 0.44–1.00)
Glucose, Bld: 95 mg/dL (ref 65–99)
POTASSIUM: 3.7 mmol/L (ref 3.5–5.1)
SODIUM: 140 mmol/L (ref 135–145)
Total Bilirubin: 0.4 mg/dL (ref 0.3–1.2)
Total Protein: 6.4 g/dL — ABNORMAL LOW (ref 6.5–8.1)

## 2017-04-06 LAB — CBC WITH DIFFERENTIAL/PLATELET
BASOS ABS: 0 10*3/uL (ref 0.0–0.1)
BASOS PCT: 0 %
Eosinophils Absolute: 0.1 10*3/uL (ref 0.0–0.7)
Eosinophils Relative: 2 %
HEMATOCRIT: 32.8 % — AB (ref 36.0–46.0)
Hemoglobin: 10.5 g/dL — ABNORMAL LOW (ref 12.0–15.0)
LYMPHS ABS: 1.8 10*3/uL (ref 0.7–4.0)
LYMPHS PCT: 24 %
MCH: 28.2 pg (ref 26.0–34.0)
MCHC: 32 g/dL (ref 30.0–36.0)
MCV: 87.9 fL (ref 78.0–100.0)
Monocytes Absolute: 0.2 10*3/uL (ref 0.1–1.0)
Monocytes Relative: 3 %
NEUTROS ABS: 5.2 10*3/uL (ref 1.7–7.7)
Neutrophils Relative %: 70 %
PLATELETS: 315 10*3/uL (ref 150–400)
RBC: 3.73 MIL/uL — AB (ref 3.87–5.11)
RDW: 13.5 % (ref 11.5–15.5)
WBC: 7.4 10*3/uL (ref 4.0–10.5)

## 2017-04-06 LAB — TYPE AND SCREEN
ABO/RH(D): A POS
ANTIBODY SCREEN: NEGATIVE

## 2017-04-06 LAB — PROTEIN / CREATININE RATIO, URINE
Creatinine, Urine: 102 mg/dL
PROTEIN CREATININE RATIO: 0.14 mg/mg{creat} (ref 0.00–0.15)
TOTAL PROTEIN, URINE: 14 mg/dL

## 2017-04-06 MED ORDER — LABETALOL HCL 5 MG/ML IV SOLN
20.0000 mg | INTRAVENOUS | Status: DC | PRN
Start: 2017-04-06 — End: 2017-04-06
  Administered 2017-04-06: 40 mg via INTRAVENOUS
  Administered 2017-04-06: 20 mg via INTRAVENOUS
  Filled 2017-04-06: qty 8
  Filled 2017-04-06: qty 4

## 2017-04-06 MED ORDER — HYDRALAZINE HCL 20 MG/ML IJ SOLN: 10.0000 mg | Freq: Once | INTRAMUSCULAR | Status: DC | PRN

## 2017-04-06 MED ORDER — NIFEDIPINE ER OSMOTIC RELEASE 30 MG PO TB24: 30.0000 mg | ORAL_TABLET | Freq: Every day | ORAL | 0 refills | Status: DC

## 2017-04-06 MED ORDER — NIFEDIPINE 10 MG PO CAPS
10.0000 mg | ORAL_CAPSULE | Freq: Once | ORAL | Status: AC
  Administered 2017-04-06: 10 mg via ORAL
  Filled 2017-04-06: qty 1

## 2017-04-06 MED ORDER — NIFEDIPINE 10 MG PO CAPS: 10.0000 mg | ORAL_CAPSULE | Freq: Four times a day (QID) | ORAL | 0 refills | Status: DC

## 2017-04-06 NOTE — Discharge Instructions (Signed)
Postpartum Hypertension °Postpartum hypertension is high blood pressure after pregnancy that remains higher than normal for more than two days after delivery. You may not realize that you have postpartum hypertension if your blood pressure is not being checked regularly. In some cases, postpartum hypertension will go away on its own, usually within a week of delivery. However, for some women, medical treatment is required to prevent serious complications, such as seizures or stroke. °The following things can affect your blood pressure: °· The type of delivery you had. °· Having received IV fluids or other medicines during or after delivery. ° °What are the causes? °Postpartum hypertension may be caused by any of the following or by a combination of any of the following: °· Hypertension that existed before pregnancy (chronic hypertension). °· Gestational hypertension. °· Preeclampsia or eclampsia. °· Receiving a lot of fluid through an IV during or after delivery. °· Medicines. °· HELLP syndrome. °· Hyperthyroidism. °· Stroke. °· Other rare neurological or blood disorders. ° °In some cases, the cause may not be known. °What increases the risk? °Postpartum hypertension can be related to one or more risk factors, such as: °· Chronic hypertension. In some cases, this may not have been diagnosed before pregnancy. °· Obesity. °· Type 2 diabetes. °· Kidney disease. °· Family history of preeclampsia. °· Other medical conditions that cause hormonal imbalances. ° °What are the signs or symptoms? °As with all types of hypertension, postpartum hypertension may not have any symptoms. Depending on how high your blood pressure is, you may experience: °· Headaches. These may be mild, moderate, or severe. They may also be steady, constant, or sudden in onset (thunderclap headache). °· Visual changes. °· Dizziness. °· Shortness of breath. °· Swelling of your hands, feet, lower legs, or face. In some cases, you may have swelling in  more than one of these locations. °· Heart palpitations or a racing heartbeat. °· Difficulty breathing while lying down. °· Decreased urination. ° °Other rare signs and symptoms may include: °· Sweating more than usual. This lasts longer than a few days after delivery. °· Chest pain. °· Sudden dizziness when you get up from sitting or lying down. °· Seizures. °· Nausea or vomiting. °· Abdominal pain. ° °How is this diagnosed? °The diagnosis of postpartum hypertension is made through a combination of physical examination findings and testing of your blood and urine. You may also have additional tests, such as a CT scan or an MRI, to check for other complications of postpartum hypertension. °How is this treated? °When blood pressure is high enough to require treatment, your options may include: °· Medicines to reduce blood pressure (antihypertensives). Tell your health care provider if you are breastfeeding or if you plan to breastfeed. There are many antihypertensive medicines that are safe to take while breastfeeding. °· Stopping medicines that may be causing hypertension. °· Treating medical conditions that are causing hypertension. °· Treating the complications of hypertension, such as seizures, stroke, or kidney problems. ° °Your health care provider will also continue to monitor your blood pressure closely and repeatedly until it is within a safe range for you. °Follow these instructions at home: °· Take medicines only as directed by your health care provider. °· Get regular exercise after your health care provider tells you that it is safe. °· Follow your health care provider’s recommendations on fluid and salt restrictions. °· Do not use any tobacco products, including cigarettes, chewing tobacco, or electronic cigarettes. If you need help quitting, ask your health care provider. °·   Keep all follow-up visits as directed by your health care provider. This is important. Contact a health care provider  if:  Your symptoms get worse.  You have new symptoms, such as: ? Headache. ? Dizziness. ? Visual changes. Get help right away if:  You develop a severe or sudden headache.  You have seizures.  You develop numbness or weakness on one side of your body.  You have difficulty thinking, speaking, or swallowing.  You develop severe abdominal pain.  You develop difficulty breathing, chest pain, a racing heartbeat, or heart palpitations. These symptoms may represent a serious problem that is an emergency. Do not wait to see if the symptoms will go away. Get medical help right away. Call your local emergency services (911 in the [place] Do not drive yourself to the hospital. This information is not intended to replace advice given to you by your health care provider. Make sure you discuss any questions you have with your health care provider. Document Released: 12/17/2013 Document Revised: 09/18/2015 Document Reviewed: 10/28/2013 Elsevier Interactive Patient Education  2018 [employer].    Postpartum Hemorrhage Postpartum hemorrhage is excessive blood loss after childbirth. Vaginal bleeding after delivery is normal and should be expected. Bleeding (lochia) will occur for several days after childbirth. This can be expected with normal vaginal deliveries and cesarean deliveries. However, postpartum hemorrhage is a potentially serious condition. What are the causes? This condition is caused by:  A loss of muscle tone in the uterus after childbirth. This can be caused by: ? An abnormal placenta. ? Infection. ? Bladder swelling (distension).  Failure to deliver all of the placenta or the retention of clots.  Wounds in the birth canal caused by delivery of the fetus.  Infection of the uterus.  Infection of tissue around the fetus.  A tear in the uterus.  Tearing of the vagina or cervix during delivery.  A maternal bleeding disorder that prevents blood from clotting (rare).  What  increases the risk? This condition is likely to develop in people who:  Have a history of postpartum hemorrhage.  Had a delivery that lasted longer than usual.  Have an excess of amniotic fluid in the amniotic sac (polyhydramnios), leading the uterus to stretch too much.  Have delivered quintuplets or more babies.  Had high blood pressure, seizures, or coma during pregnancy.  Had a condition called preeclampsia or eclampsia during pregnancy.  Had problems with the placenta.  Had complications during labor or delivery.  Are obese.  Are 67 years old or older.  Are Asian or Hispanic.  What are the signs or symptoms? Symptoms of this condition include:  Passing large clots or pieces of tissue. These may be small pieces of placenta left after delivery.  Soaking more than one sanitary pad per hour for several hours.  Heavy, bright-red bleeding that occurs 4 days or more after delivery.  Discharge that has a bad smell.  An unexplained fever.  Nausea or vomiting.  Pain or swelling near the vagina or perineum.  A drop in blood pressure.  Lightheadedness or fainting.  Shortness of breath.  A fast heart rate that happens with very little activity.  Signs of shock, such as: ? Blurry vision. ? Chills. ? Dizziness. ? Weakness.  How is this diagnosed? This condition may be diagnosed based on:  Your symptoms.  A physical exam of your perineum, vagina, cervix, and uterus.  Tests, including: ? Blood pressure and pulse measurements. ? Blood tests. ? Blood clotting tests. ?  Ultrasonography.  How is this treated? Treatment for this condition depends on the severity of your symptoms. It may include:  Uterine massage.  Medicines to help the uterus contract.  Blood transfusions.  Fluids given through the vein.  A medical procedure to compress arteries supplying the uterus.  Sometimes bleeding occurs if portions of the placenta are left behind in the uterus  after delivery. If this happens, a curettage or scraping of the inside of the uterus must be done (rare). This usually stops the bleeding. If curettage does not stop the bleeding, surgery may be done to remove the uterus (hysterectomy), but this rarely occurs. If bleeding is due to clotting or bleeding problems that are not related to the pregnancy, other treatments may be needed. Follow these instructions at home:  Limit your activity as directed by your health care provider.Your health care provider may order bed rest (getting up to go to the bathroom only) or may allow you to continue light activity.  Keep track of the number of pads you use each day and how soaked (saturated) they are. Write down this information.  Do not use tampons.  Do not douche or have sexual intercourse until your health care provider approves.  Drink enough fluids to keep your urine clear or pale yellow.  Get enough rest.  Eat foods that are rich in iron, such as spinach, red meat, and legumes.  Take any over-the-counter and prescription medicines only as told by your health care provider.  Keep all follow-up visits as told by your health care provider. This is important. Get help right away if:  You experience severe cramps in your stomach, back, or abdomen.  You have a fever.  You pass large clots or tissue. Save any tissue for your health care provider to look at.  Your bleeding increases.  You have heavy bleeding that soaks one pad per hour for 2 hours in a row.  You faint or become dizzy, weak, or lightheaded.  Your sanitary pad count per hour is increasing.  You are urinating less than usual or not at all.  You have shortness of breath.  Your heart rate is faster than usual.  You have sudden chest pain. This information is not intended to replace advice given to you by your health care provider. Make sure you discuss any questions you have with your health care provider. Document  Released: 07/06/2003 Document Revised: 12/13/2015 Document Reviewed: 11/17/2015 Elsevier Interactive Patient Education  [employer].
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
## 2017-04-06 NOTE — MAU Note (Signed)
Pt arrived by EMS c/o gush of bleeding at 0500 this am after feeding newborn.  Pt unsure how many pads she has used since but reports she has only been using 1 a day pror to.  Pt also has had increased BP in the last week.  Neuro assessment WNL, pitting edema +2 in lower extremities.

## 2018-09-07 IMAGING — US US MFM OB DETAIL+14 WK
1 series · 13 of 28 positions shown · non-contrast
Comparison: none

[Series 1: us mfm ob detail+14 wk · 13 of 120 slices shown]
[im 5/120]
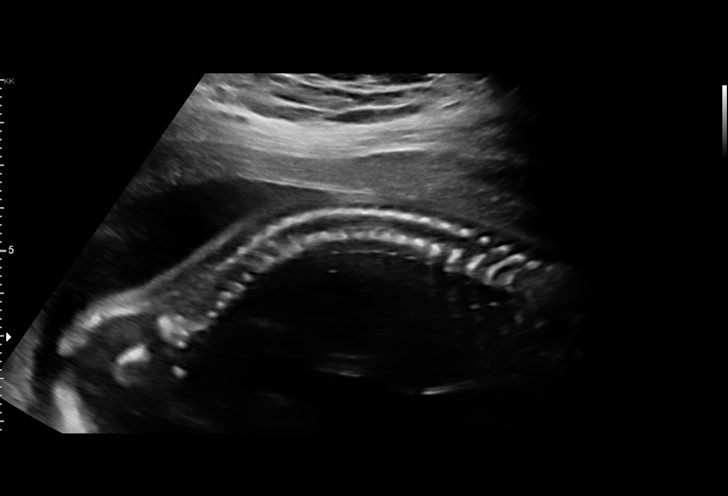
[im 14/120]
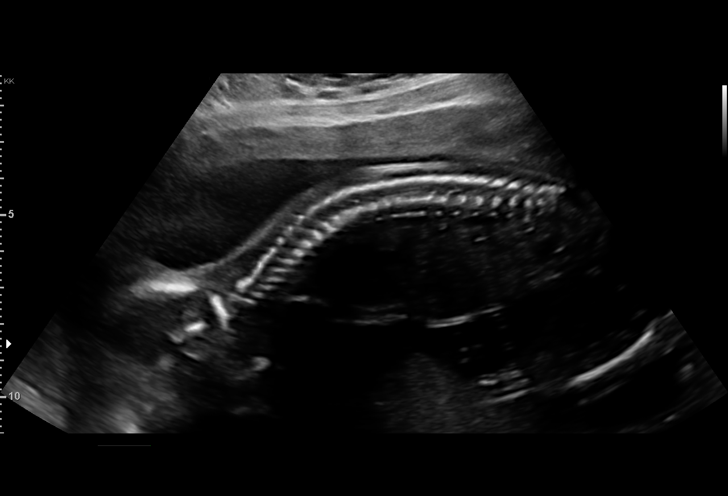
[im 23/120]
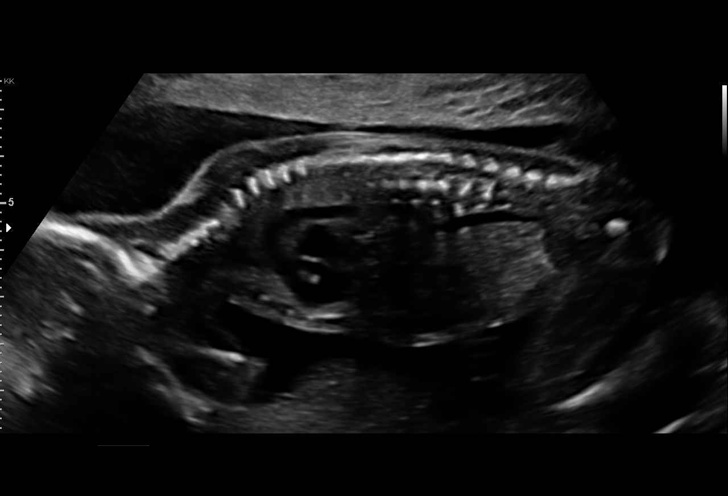
[im 31/120]
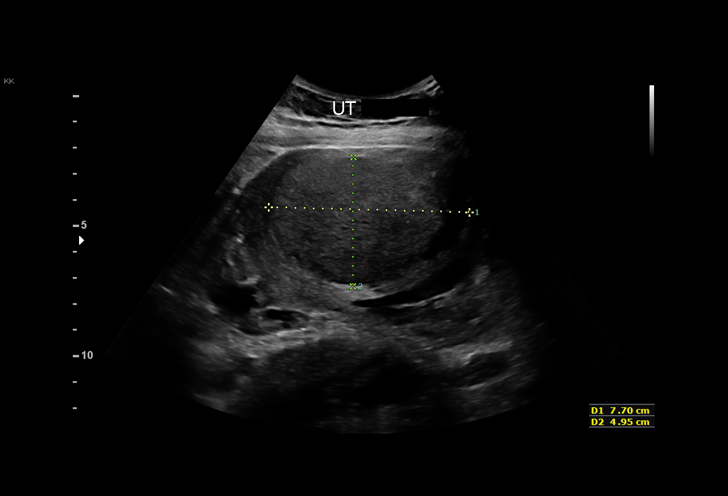
[im 40/120]
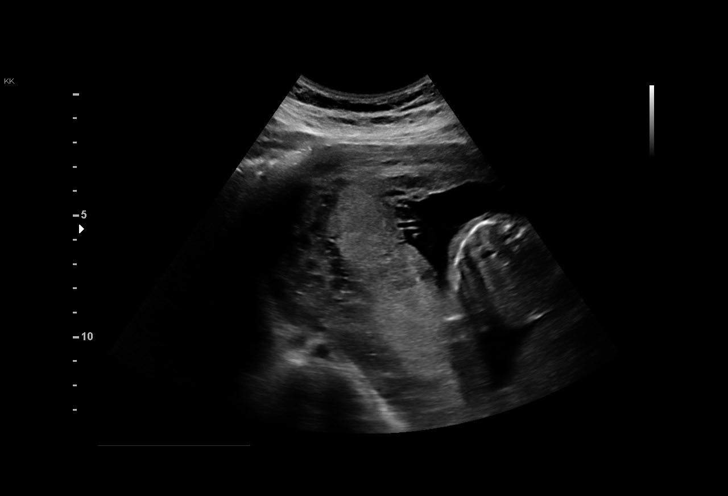
[im 49/120]
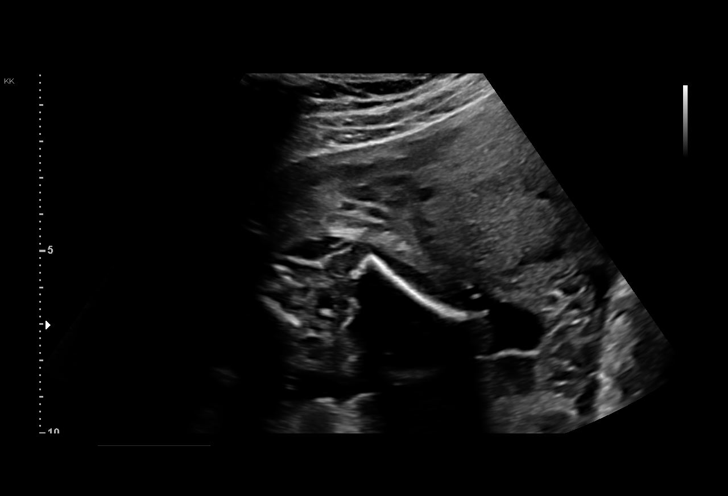
[im 62/120]
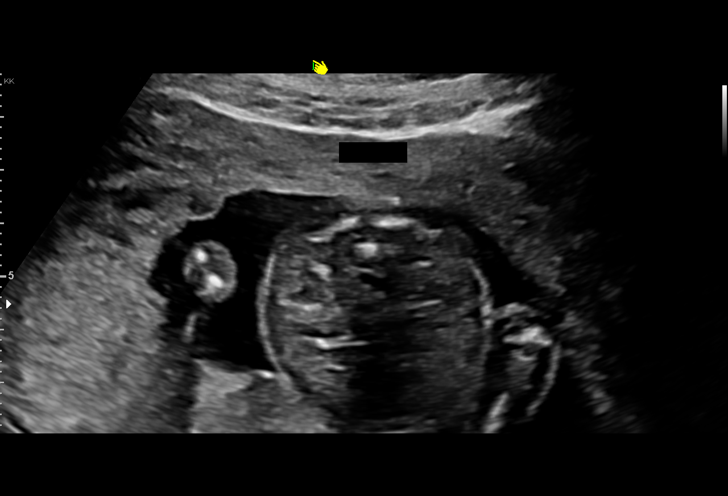
[im 71/120]
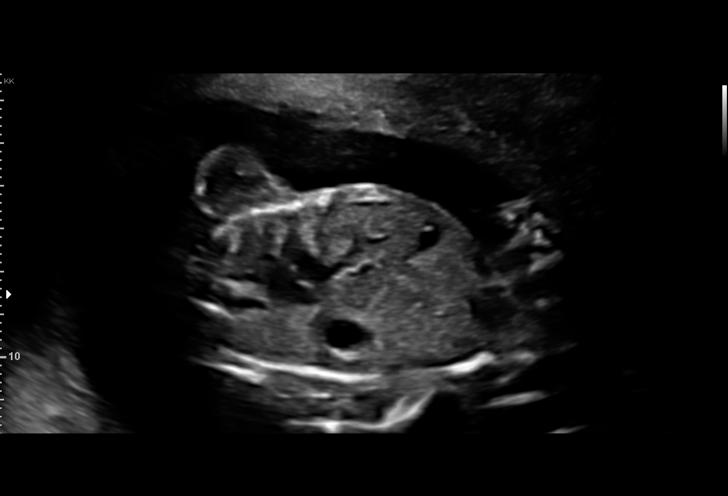
[im 80/120]
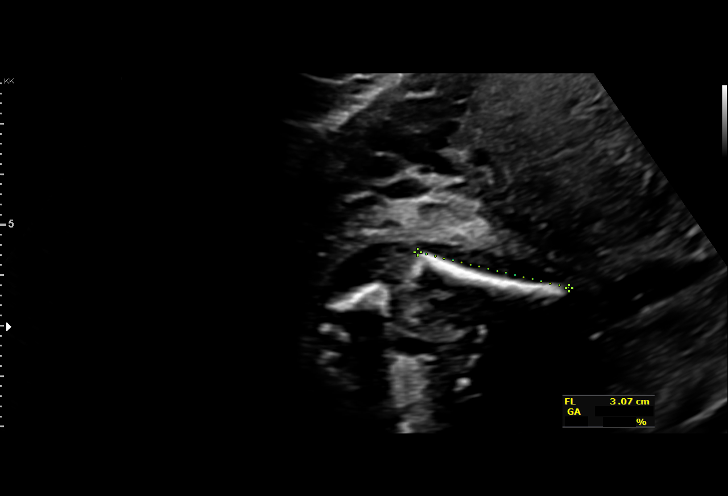
[im 89/120]
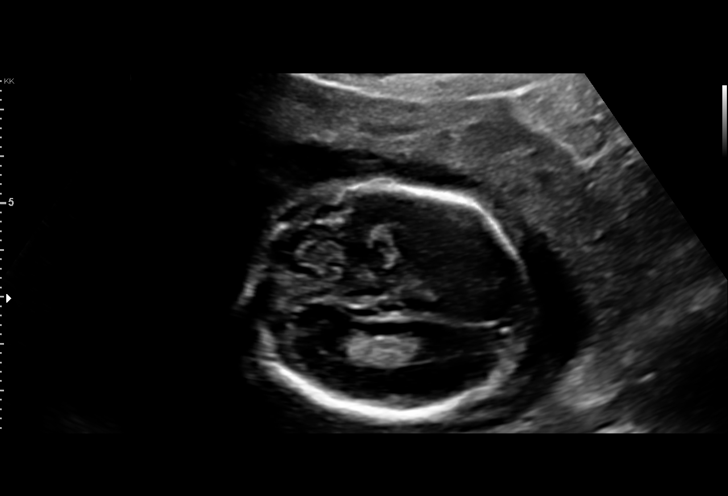
[im 97/120]
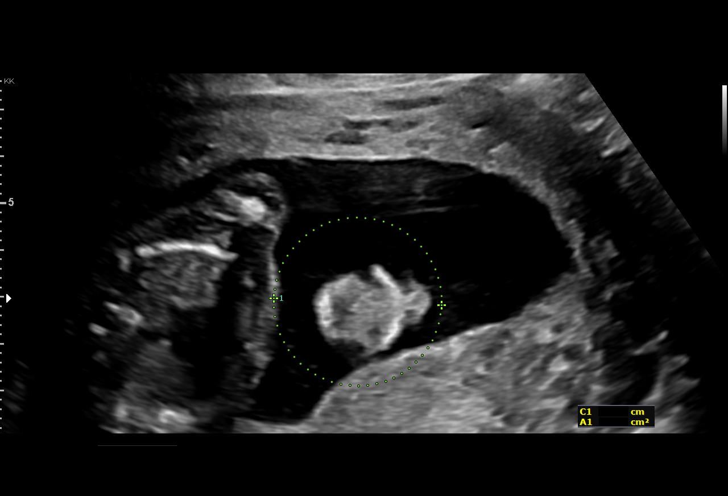
[im 106/120]
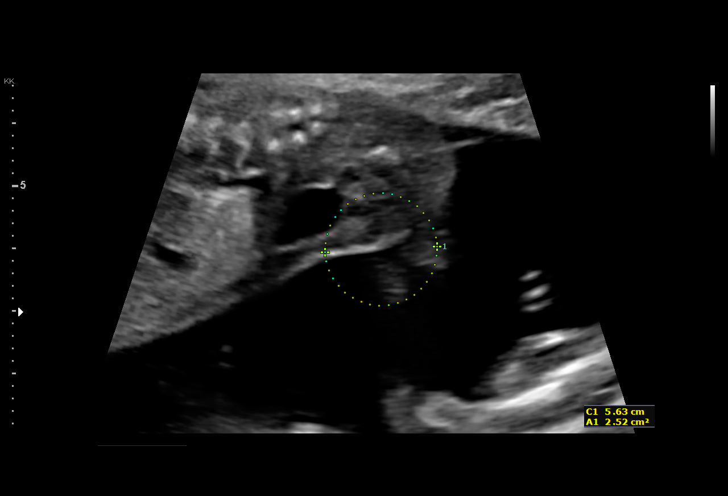
[im 115/120]
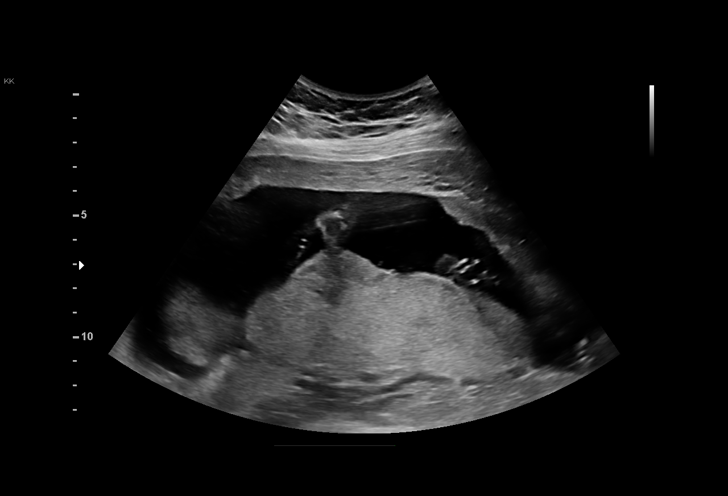

[13 of 28 positions shown; findings below may reference images not displayed]

[facility]

1  RUDOLPH HER          [id number]      [id number]     [id number]
Indications

20 weeks gestation of pregnancy
Encounter for antenatal screening for
malformations
Advanced maternal age multigravida 35+,
second trimester(Panorama Low Risk)
Uterine fibroids affecting pregnancy in        O34.12,
second trimester, antepartum
Maternal thalassemia complicating
pregnancy in second trimester
OB History

Blood Type:            Height:  5'7"   Weight (lb):  187       BMI:
Gravidity:    2         Term:   0        Prem:   0        SAB:   1
TOP:          0       Ectopic:  0        Living: 0
Fetal Evaluation

Num Of Fetuses:     1
Fetal Heart         146
Rate(bpm):
Cardiac Activity:   Observed
Presentation:       Breech
Placenta:           Posterior, above cervical os
P. Cord Insertion:  Visualized

Amniotic Fluid
AFI FV:      Subjectively within normal limits

Largest Pocket(cm)
5.4
Biometry

BPD:      48.9  mm     G. Age:  20w 5d         80  %    CI:        70.74   %    70 - 86
FL/HC:      16.9   %    16.8 -
HC:      185.3  mm     G. Age:  20w 6d         79  %    HC/AC:      1.25        1.09 -
AC:      148.1  mm     G. Age:  20w 1d         47  %    FL/BPD:     64.2   %
FL:       31.4  mm     G. Age:  19w 5d         34  %    FL/AC:      21.2   %    20 - 24
HUM:      33.4  mm     G. Age:  21w 2d         88  %
NFT:       4.8  mm

Est. FW:     329  gm    0 lb 12 oz      51  %
Gestational Age

LMP:           20w 0d        Date:  07/05/16                 EDD:   04/11/17
U/S Today:     20w 3d                                        EDD:   04/08/17
Best:          20w 0d     Det. By:  LMP  (07/05/16)          EDD:   04/11/17
Anatomy

Cranium:               Appears normal         Aortic Arch:            Appears normal
Cavum:                 Appears normal         Ductal Arch:            Appears normal
Ventricles:            Appears normal         Diaphragm:              Appears normal
Choroid Plexus:        Appears normal         Stomach:                Appears normal, left
sided
Cerebellum:            Appears normal         Abdomen:                Appears normal
Posterior Fossa:       Appears normal         Abdominal Wall:         Not well visualized
Nuchal Fold:           Appears normal         Cord Vessels:           Appears normal (3
vessel cord)
Face:                  Orbits appear          Kidneys:                Appear normal
normal
Lips:                  Appears normal         Bladder:                Appears normal
Thoracic:              Appears normal         Spine:                  Appears normal
Heart:                 Appears normal         Upper Extremities:      Appears normal
(4CH, axis, and si
RVOT:                  Not well visualized    Lower Extremities:      Appears normal
LVOT:                  Not well visualized

Other:  Female gender. Heels visualized. Technically difficult due to fetal
position.
Cervix Uterus Adnexa

Cervix
Length:            3.4  cm.
Normal appearance by transabdominal scan.

Uterus
Multiple fibroids noted, see table below.

Adnexa:       No abnormality visualized.
Myomas

Site                     L(cm)      W(cm)      D(cm)      Location
Anterior Left            7.7        5
Anterior Left
Blood Flow                 RI        PI       Comments
Impression

SIUP at 10w0d, AMA, low risk NIPS, thalassemia trait, fibroid
uterus
active singleton fetus
no dysmorphic features demonstrated
limited views as noted above owing to insonating
characteristics in setting of large anterior uterine fibroid
EFW 51st%
no previa
amniotic fluid volume is gestational age appropriate
uterine fibroids seen and measured as noted above
Recommendations

Interval growth in 6 weeks.
See genetic counseling.

## 2018-11-30 IMAGING — US US MFM OB FOLLOW-UP
1 series · 14 of 28 positions shown · non-contrast
Comparison: none

[Series 1: us mfm ob follow-up · 14 of 55 slices shown]
[im 3/55]
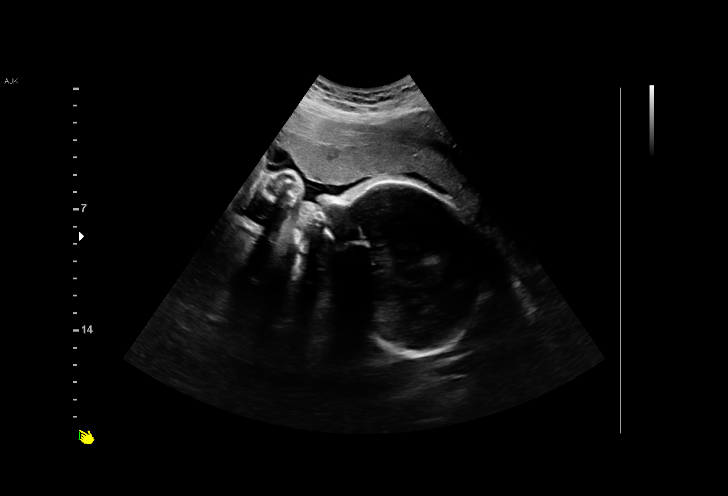
[im 7/55]
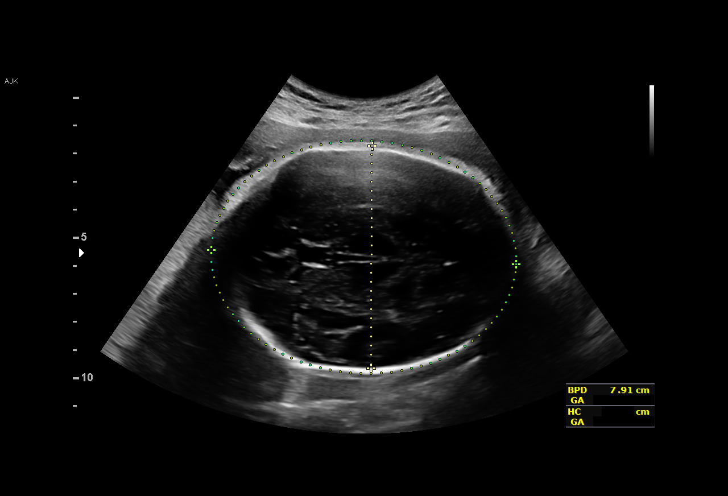
[im 11/55]
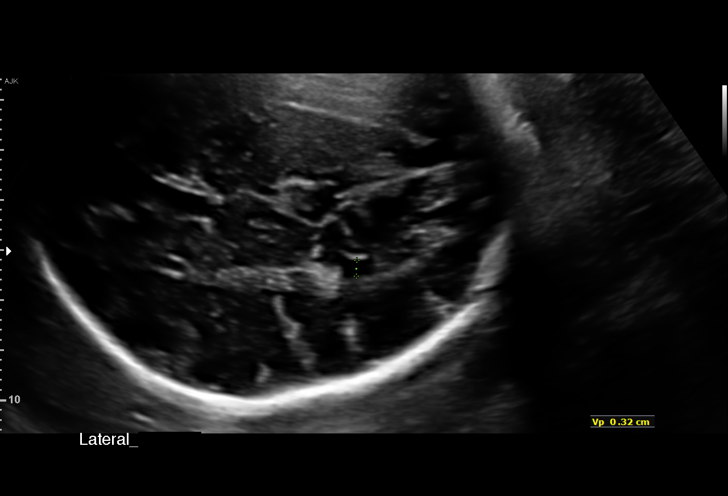
[im 15/55]
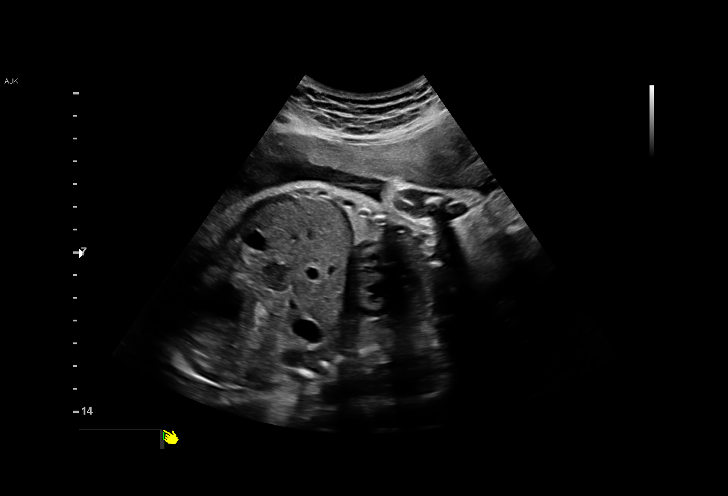
[im 19/55]
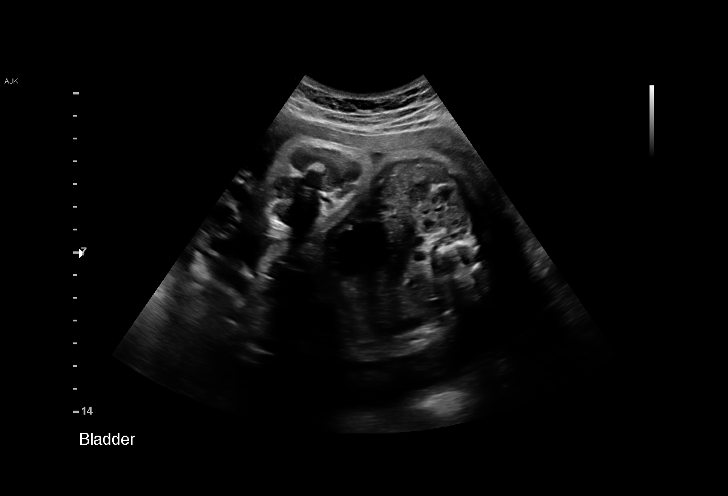
[im 23/55]
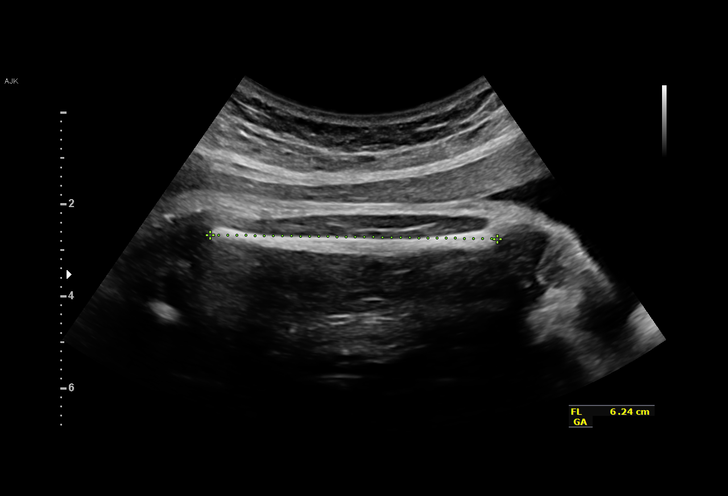
[im 27/55]
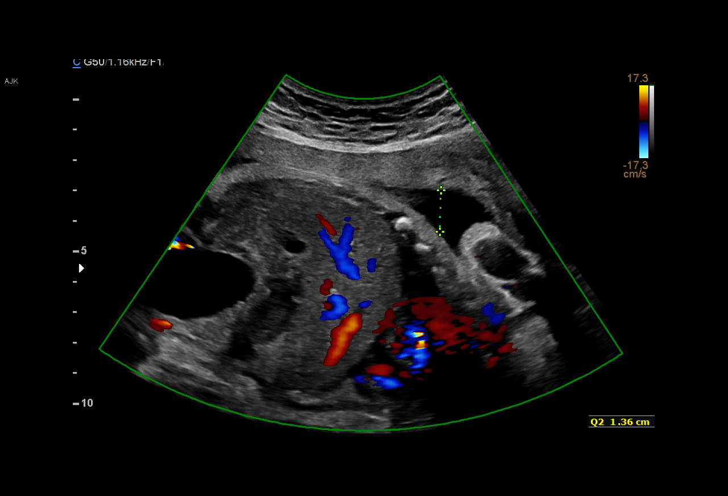
[im 31/55]
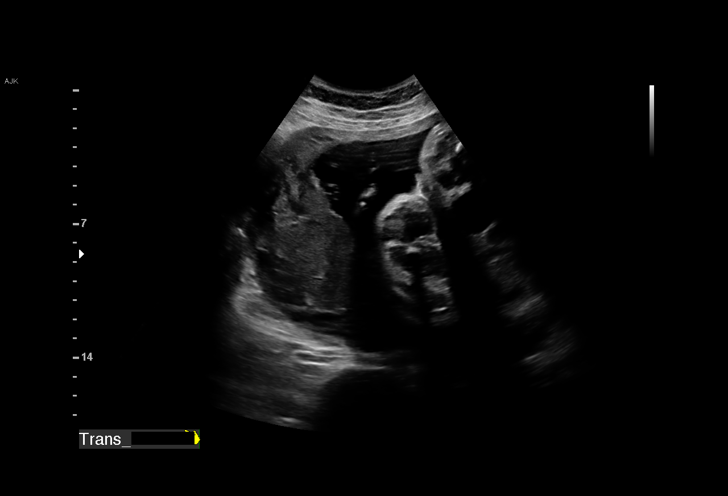
[im 35/55]
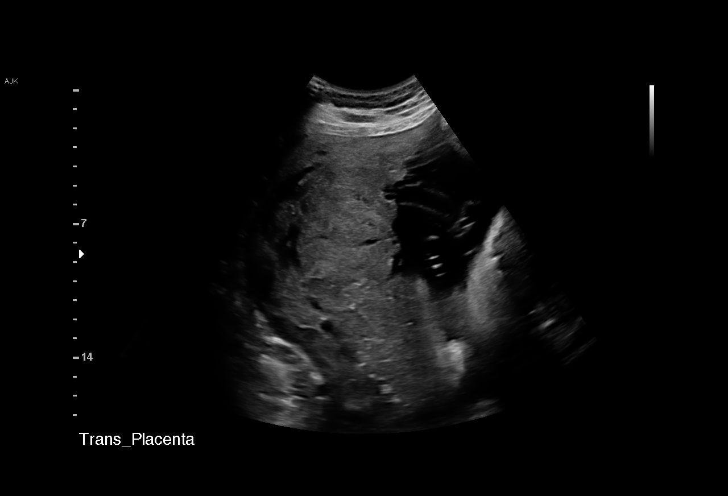
[im 39/55]
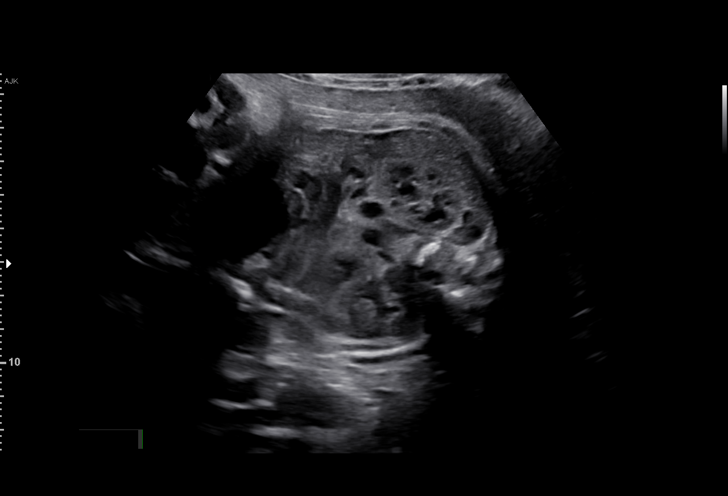
[im 43/55]
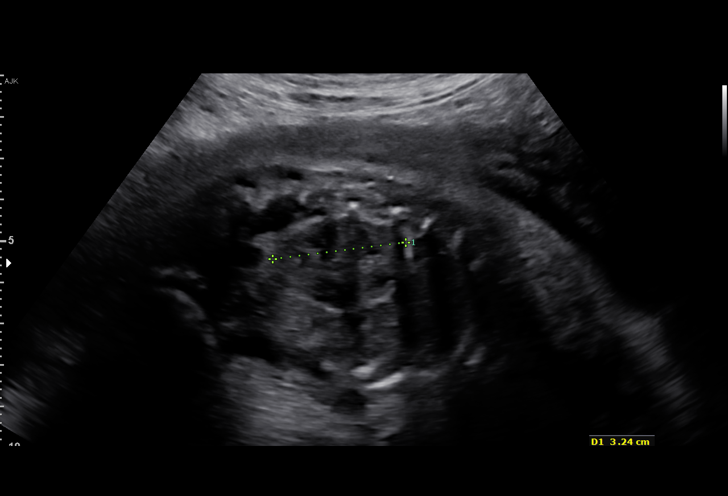
[im 47/55]
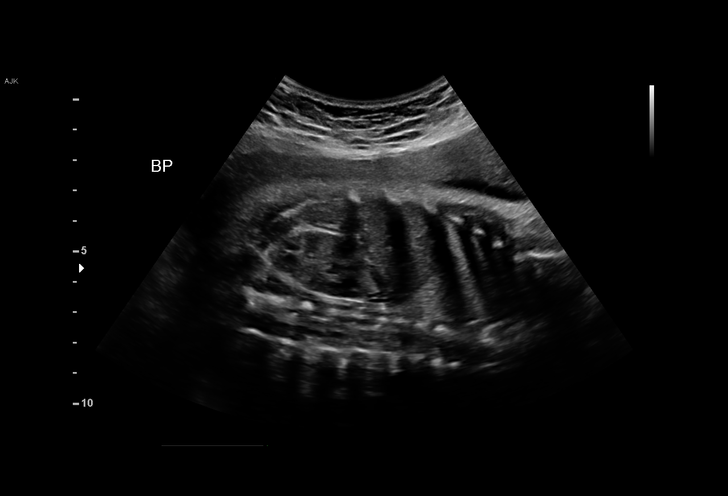
[im 51/55]
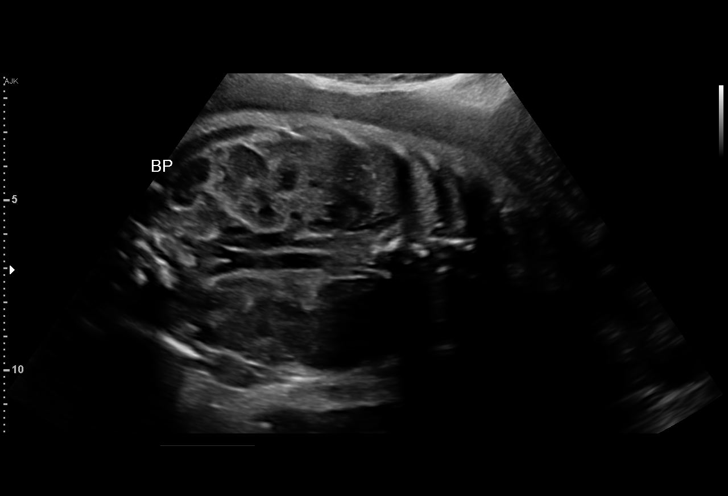
[im 55/55]
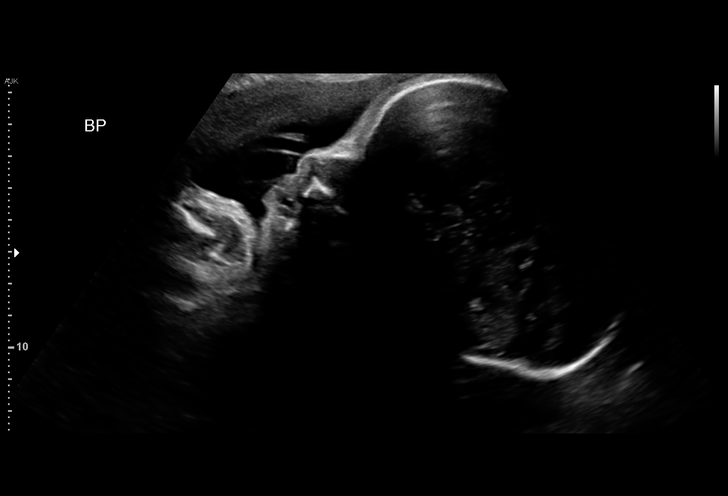

[14 of 28 positions shown; findings below may reference images not displayed]

Road [HOSPITAL]

Indications

32 weeks gestation of pregnancy
Advanced maternal age multigravida 35+,
third trimester (low risk NIPS)
Uterine fibroids affecting pregnancy in third  O34.13,
trimester, antepartum
Maternal thalassemia complicating
pregnancy in third trimester; S/P genetic
counseling
OB History

Blood Type:            Height:  5'7"   Weight (lb):  187       BMI:
Gravidity:    2         Term:   0        Prem:   0        SAB:   1
TOP:          0       Ectopic:  0        Living: 0
Fetal Evaluation

Num Of Fetuses:     1
Fetal Heart         143
Rate(bpm):
Cardiac Activity:   Observed
Presentation:       Cephalic
Placenta:           Posterior, above cervical os
P. Cord Insertion:  Visualized, central

Amniotic Fluid
AFI FV:      Subjectively within normal limits

AFI Sum(cm)     %Tile       Largest Pocket(cm)
11.53           28

RUQ(cm)       RLQ(cm)       LUQ(cm)        LLQ(cm)
3.1
Biometry

BPD:      79.4  mm     G. Age:  31w 6d         37  %    CI:        71.87   %    70 - 86
FL/HC:      20.7   %    19.1 -
HC:      298.1  mm     G. Age:  33w 0d         40  %    HC/AC:      1.05        0.96 -
AC:      285.1  mm     G. Age:  32w 4d         64  %    FL/BPD:     77.7   %    71 - 87
FL:       61.7  mm     G. Age:  32w 0d         38  %    FL/AC:      21.6   %    20 - 24

Est. FW:    3562  gm      4 lb 5 oz     63  %
Gestational Age

LMP:           32w 0d        Date:  07/05/16                 EDD:   04/11/17
U/S Today:     32w 3d                                        EDD:   04/08/17
Best:          32w 0d     Det. By:  LMP  (07/05/16)          EDD:   04/11/17
Anatomy

Cranium:               Appears normal         Aortic Arch:            Previously seen
Cavum:                 Appears normal         Ductal Arch:            Previously seen
Ventricles:            Appears normal         Diaphragm:              Appears normal
Choroid Plexus:        Previously seen        Stomach:                Appears normal, left
sided
Cerebellum:            Previously seen        Abdomen:                Appears normal
Posterior Fossa:       Previously seen        Abdominal Wall:         Previously seen
Nuchal Fold:           Previously seen        Cord Vessels:           Appears normal (3
vessel cord)
Face:                  Orbits and profile     Kidneys:                Appear normal
previously seen
Lips:                  Appears normal         Bladder:                Appears normal
Thoracic:              Previously seen        Spine:                  Previously seen
Heart:                 Previously seen        Upper Extremities:      Previously seen
RVOT:                  Previously seen        Lower Extremities:      Previously seen
LVOT:                  Previously seen

Other:  Female gender. Heels prev visualized.
Cervix Uterus Adnexa

Cervix
Not visualized (advanced GA >34wks)

Uterus
Multiple fibroids previously noted

Left Ovary
Not visualized.

Right Ovary
Not visualized.

Adnexa:       No abnormality visualized.
Impression

SIUP at 32+0 weeks
Normal interval anatomy; anatomic survey complete
Normal amniotic fluid volume
Appropriate interval growth with EFW at the 63rd %tile
Fibroid uterus (not measured today)
Recommendations

Follow-up as clinically indicated

## 2019-02-26 ENCOUNTER — Encounter: Payer: Self-pay | Admitting: *Deleted

## 2019-06-05 ENCOUNTER — Inpatient Hospital Stay (HOSPITAL_COMMUNITY)
Admission: AD | Admit: 2019-06-05 | Discharge: 2019-06-05 | Disposition: A | Discharge status: Home / Self Care | Payer: 59 | Attending: Obstetrics and Gynecology | Admitting: Obstetrics and Gynecology

## 2019-06-05 ENCOUNTER — Inpatient Hospital Stay (HOSPITAL_COMMUNITY): Payer: 59

## 2019-06-05 ENCOUNTER — Other Ambulatory Visit: Payer: Self-pay

## 2019-06-05 ENCOUNTER — Encounter (HOSPITAL_COMMUNITY): Payer: Self-pay | Admitting: Obstetrics and Gynecology

## 2019-06-05 DIAGNOSIS — O09522 Supervision of elderly multigravida, second trimester: Secondary | ICD-10-CM | POA: Diagnosis not present

## 2019-06-05 DIAGNOSIS — Z3A01 Less than 8 weeks gestation of pregnancy: Secondary | ICD-10-CM | POA: Diagnosis not present

## 2019-06-05 DIAGNOSIS — O209 Hemorrhage in early pregnancy, unspecified: Secondary | ICD-10-CM | POA: Diagnosis present

## 2019-06-05 DIAGNOSIS — O3680X0 Pregnancy with inconclusive fetal viability, not applicable or unspecified: Secondary | ICD-10-CM

## 2019-06-05 LAB — CBC
HCT: 38.9 % (ref 36.0–46.0)
Hemoglobin: 12.7 g/dL (ref 12.0–15.0)
MCH: 27.9 pg (ref 26.0–34.0)
MCHC: 32.6 g/dL (ref 30.0–36.0)
MCV: 85.3 fL (ref 80.0–100.0)
Platelets: 215 10*3/uL (ref 150–400)
RBC: 4.56 MIL/uL (ref 3.87–5.11)
RDW: 12.4 % (ref 11.5–15.5)
WBC: 5.5 10*3/uL (ref 4.0–10.5)
nRBC: 0 % (ref 0.0–0.2)

## 2019-06-05 LAB — URINALYSIS, ROUTINE W REFLEX MICROSCOPIC
Bilirubin Urine: NEGATIVE
Glucose, UA: NEGATIVE mg/dL
Ketones, ur: NEGATIVE mg/dL
Nitrite: NEGATIVE
Protein, ur: NEGATIVE mg/dL
Specific Gravity, Urine: 1.011 (ref 1.005–1.030)
pH: 6 (ref 5.0–8.0)

## 2019-06-05 LAB — WET PREP, GENITAL
Clue Cells Wet Prep HPF POC: NONE SEEN
Sperm: NONE SEEN
Trich, Wet Prep: NONE SEEN

## 2019-06-05 LAB — PREGNANCY, URINE: Preg Test, Ur: POSITIVE — AB

## 2019-06-05 LAB — HIV ANTIBODY (ROUTINE TESTING W REFLEX): HIV Screen 4th Generation wRfx: NONREACTIVE

## 2019-06-05 LAB — HCG, QUANTITATIVE, PREGNANCY: hCG, Beta Chain, Quant, S: 41 m[IU]/mL — ABNORMAL HIGH (ref <5)

## 2019-06-05 LAB — POCT PREGNANCY, URINE: Preg Test, Ur: NEGATIVE

## 2019-06-05 NOTE — MAU Provider Note (Signed)
Chief Complaint: Vaginal Bleeding and Abdominal Pain   None     SUBJECTIVE HPI: Brandi Hardy is a 39 y.o. G3P1011 at [redacted]w[redacted]d by LMP who presents to maternity admissions reporting vaginal bleeding with onset this morning. She reports the bleeding requires a pad but is not soaking pads today. There is mild associated cramping.  She had positive UPT on 06/04/19. There are no other symptoms. She has not tried any treatments.      HPI  Past Medical History:  Diagnosis Date  . Fibroid   . GERD (gastroesophageal reflux disease)   . Malaria 1988   Past Surgical History:  Procedure Laterality Date  . CESAREAN SECTION N/A 03/28/2017   Procedure: CESAREAN SECTION;  Surgeon: Dian Queen, MD;  Location: [facility];  Service: Obstetrics;  Laterality: N/A;  . NO PAST SURGERIES     Social History   Socioeconomic History  . Marital status: Married    Spouse name: Not on file  . Number of children: Not on file  . Years of education: Not on file  . Highest education level: Not on file  Occupational History  . Not on file  Tobacco Use  . Smoking status: Never Smoker  . Smokeless tobacco: Never Used  Substance and Sexual Activity  . Alcohol use: Yes    Comment: occasional; not while preg  . Drug use: No  . Sexual activity: Yes    Birth control/protection: None  Other Topics Concern  . Not on file  Social History Narrative  . Not on file   Social Determinants of Health   Financial Resource Strain:   . Difficulty of Paying Living Expenses: Not on file  Food Insecurity:   . Worried About Charity fundraiser in the Last Year: Not on file  . Ran Out of Food in the Last Year: Not on file  Transportation Needs:   . Lack of Transportation (Medical): Not on file  . Lack of Transportation (Non-Medical): Not on file  Physical Activity:   . Days of Exercise per Week: Not on file  . Minutes of Exercise per Session: Not on file  Stress:   . Feeling of Stress : Not on file   Social Connections:   . Frequency of Communication with Friends and Family: Not on file  . Frequency of Social Gatherings with Friends and Family: Not on file  . Attends Religious Services: Not on file  . Active Member of Clubs or Organizations: Not on file  . Attends Archivist Meetings: Not on file  . Marital Status: Not on file  Intimate Partner Violence:   . Fear of Current or Ex-Partner: Not on file  . Emotionally Abused: Not on file  . Physically Abused: Not on file  . Sexually Abused: Not on file   No current facility-administered medications on file prior to encounter.   Current Outpatient Medications on File Prior to Encounter  Medication Sig Dispense Refill  . calcium carbonate (TUMS - DOSED IN MG ELEMENTAL CALCIUM) 500 MG chewable tablet Chew 2 tablets daily as needed by mouth for indigestion or heartburn.    . cephALEXin (KEFLEX) 500 MG capsule Take 500 mg by mouth 2 (two) times daily.   0  . ferrous sulfate 325 (65 FE) MG tablet Take 1 tablet (325 mg total) by mouth daily with breakfast. 30 tablet 3  . ibuprofen (ADVIL,MOTRIN) 600 MG tablet Take 1 tablet (600 mg total) by mouth every 6 (six) hours. 30 tablet 0  .  NIFEdipine (PROCARDIA) 10 MG capsule Take 1 capsule (10 mg total) by mouth every 6 (six) hours. 4:00pm and 10:00pm 2 capsule 0  . NIFEdipine (PROCARDIA-XL/ADALAT-CC/NIFEDICAL-XL) 30 MG 24 hr tablet Take 1 tablet (30 mg total) by mouth daily. First dose tonight at 10:00pm, then daily in the morning. 30 tablet 0  . oxyCODONE-acetaminophen (PERCOCET/ROXICET) 5-325 MG tablet Take 1-2 tablets by mouth every 4 (four) hours as needed (pain scale > 7). 30 tablet 0  . Prenatal Vit-Fe Fumarate-FA (PRENATAL MULTIVITAMIN) TABS tablet Take 1 tablet by mouth daily at 12 noon.     No Known Allergies  ROS:  Review of Systems  Constitutional: Negative for chills, fatigue and fever.  Respiratory: Negative for shortness of breath.   Cardiovascular: Negative for chest  pain.  Gastrointestinal: Positive for abdominal pain. Negative for nausea and vomiting.  Genitourinary: Positive for vaginal bleeding. Negative for difficulty urinating, dysuria, flank pain, pelvic pain, vaginal discharge and vaginal pain.  Neurological: Negative for dizziness and headaches.  Psychiatric/Behavioral: Negative.      I have reviewed patient's Past Medical Hx, Surgical Hx, Family Hx, Social Hx, medications and allergies.   Physical Exam   Patient Vitals for the past 24 hrs:  BP Temp Temp src Pulse Resp SpO2 Weight  06/05/19 1439 126/72 98.6 F (37 C) Oral 92 16 99 % --  06/05/19 1436 -- -- -- -- -- -- 74.8 kg   Constitutional: Well-developed, well-nourished female in no acute distress.  Cardiovascular: normal rate Respiratory: normal effort GI: Abd soft, non-tender. Pos BS x 4 MS: Extremities nontender, no edema, normal ROM Neurologic: Alert and oriented x 4.  GU: Neg CVAT.  PELVIC EXAM: Cervix pink, visually closed, without lesion,small amount dark red bleeding requiring 1 fox swab to visualize cervix, vaginal walls and external genitalia normal Bimanual exam: Cervix 0/long/high, firm, anterior, neg CMT, uterus nontender, nonenlarged, adnexa without tenderness, enlargement, or mass   LAB RESULTS Results for orders placed or performed during the hospital encounter of 06/05/19 (from the past 24 hour(s))  Pregnancy, urine POC     Status: None   Collection Time: 06/05/19  2:32 PM  Result Value Ref Range   Preg Test, Ur NEGATIVE NEGATIVE  hCG, quantitative, pregnancy     Status: Abnormal   Collection Time: 06/05/19  3:08 PM  Result Value Ref Range   hCG, Beta Chain, Quant, S 41 (H) <5 mIU/mL  CBC     Status: None   Collection Time: 06/05/19  4:28 PM  Result Value Ref Range   WBC 5.5 4.0 - 10.5 K/uL   RBC 4.56 3.87 - 5.11 MIL/uL   Hemoglobin 12.7 12.0 - 15.0 g/dL   HCT 38.9 36.0 - 46.0 %   MCV 85.3 80.0 - 100.0 fL   MCH 27.9 26.0 - 34.0 pg   MCHC 32.6 30.0 -  36.0 g/dL   RDW 12.4 11.5 - 15.5 %   Platelets 215 150 - 400 K/uL   nRBC 0.0 0.0 - 0.2 %  HIV Antibody (routine testing w rflx)     Status: None   Collection Time: 06/05/19  4:28 PM  Result Value Ref Range   HIV Screen 4th Generation wRfx NON REACTIVE NON REACTIVE  ABO/Rh     Status: None   Collection Time: 06/05/19  4:28 PM  Result Value Ref Range   ABO/RH(D)      A POS Performed at [facility], [address] [place], [place] [address]   Wet prep, genital  Status: Abnormal   Collection Time: 06/05/19  4:39 PM  Result Value Ref Range   Yeast Wet Prep HPF POC PRESENT (A) NONE SEEN   Trich, Wet Prep NONE SEEN NONE SEEN   Clue Cells Wet Prep HPF POC NONE SEEN NONE SEEN   WBC, Wet Prep HPF POC FEW (A) NONE SEEN   Sperm NONE SEEN   Urinalysis, Routine w reflex microscopic     Status: Abnormal   Collection Time: 06/05/19  4:44 PM  Result Value Ref Range   Color, Urine YELLOW YELLOW   APPearance HAZY (A) CLEAR   Specific Gravity, Urine 1.011 1.005 - 1.030   pH 6.0 5.0 - 8.0   Glucose, UA NEGATIVE NEGATIVE mg/dL   Hgb urine dipstick LARGE (A) NEGATIVE   Bilirubin Urine NEGATIVE NEGATIVE   Ketones, ur NEGATIVE NEGATIVE mg/dL   Protein, ur NEGATIVE NEGATIVE mg/dL   Nitrite NEGATIVE NEGATIVE   Leukocytes,Ua TRACE (A) NEGATIVE   RBC / HPF 0-5 0 - 5 RBC/hpf   WBC, UA 0-5 0 - 5 WBC/hpf   Bacteria, UA RARE (A) NONE SEEN   Squamous Epithelial / LPF 0-5 0 - 5   Mucus PRESENT   Pregnancy, urine     Status: Abnormal   Collection Time: 06/05/19  4:45 PM  Result Value Ref Range   Preg Test, Ur POSITIVE (A) NEGATIVE    --/--/A POS Performed at [facility], [address] [place], [place] [address]  (02/06 1628)  IMAGING US OB LESS THAN 14 WEEKS WITH OB TRANSVAGINAL  Result Date: 06/05/2019 CLINICAL DATA:  Vaginal bleeding EXAM: OBSTETRIC <14 WK [place] AND TRANSVAGINAL OB US TECHNIQUE: Both transabdominal and transvaginal ultrasound examinations were performed for  complete evaluation of the gestation as well as the maternal uterus, adnexal regions, and pelvic cul-de-sac. Transvaginal technique was performed to assess early pregnancy. COMPARISON:  None. FINDINGS: Intrauterine gestational sac: Single Yolk sac:  Not visualized Embryo:  Not visualized Cardiac Activity: Not visualized Heart Rate:   bpm MSD: 6.1 mm   5 w   2 d CRL:    mm    w    d                  [place] EDC: Subchorionic hemorrhage:  None visualized. Maternal uterus/adnexae: Left corpus luteal cyst. 3.6 cm left fundal fibroid. No adnexal mass or free fluid. IMPRESSION: Probable early intrauterine gestational sac, but no yolk sac, fetal pole, or cardiac activity yet visualized. Recommend follow-up quantitative B-HCG levels and follow-up US in 14 days to assess viability. This recommendation follows SRU consensus guidelines: Diagnostic Criteria for Nonviable Pregnancy Early in the First Trimester. [employer] Med [id number]. Electronically Signed   By: Rolm Baptise M.D.   On: 06/05/2019 17:27    MAU Management/MDM: Orders Placed This Encounter  Procedures  . Wet prep, genital  . US OB LESS THAN 14 WEEKS WITH OB TRANSVAGINAL  . Pregnancy, urine  . hCG, quantitative, pregnancy  . CBC  . HIV Antibody (routine testing w rflx)  . Urinalysis, Routine w reflex microscopic  . Pregnancy, urine POC  . ABO/Rh  . Discharge patient    No orders of the defined types were placed in this encounter.   Findings today could represent a normal early pregnancy, spontaneous abortion or ectopic pregnancy which can be life-threatening.  Ectopic precautions were given to the patient with plan to return in 48 hours for repeat quant hcg to evaluate pregnancy development.  Message left with after hours number at [facility] for follow up hcg on Monday, 06/07/19. Pt to call office Monday morning. Return to MAU as needed for worsening symptoms or emergencies.   ASSESSMENT 1. Pregnancy of unknown anatomic location    2. Vaginal bleeding in pregnancy, first trimester     PLAN Discharge home with bleeding/ectopic precautions  Allergies as of 06/05/2019   No Known Allergies     Medication List    STOP taking these medications   calcium carbonate 500 MG chewable tablet Commonly known as: TUMS - dosed in mg elemental calcium   cephALEXin 500 MG capsule Commonly known as: KEFLEX   ibuprofen 600 MG tablet Commonly known as: ADVIL   NIFEdipine 10 MG capsule Commonly known as: Procardia   NIFEdipine 30 MG 24 hr tablet Commonly known as: PROCARDIA-XL/NIFEDICAL-XL   oxyCODONE-acetaminophen 5-325 MG tablet Commonly known as: PERCOCET/ROXICET     TAKE these medications   ferrous sulfate 325 (65 FE) MG tablet Take 1 tablet (325 mg total) by mouth daily with breakfast.   prenatal multivitamin Tabs tablet Take 1 tablet by mouth daily at 12 noon.      Follow-up Information    [place] Follow up in 2 day(s).   Why: You will need repeat labs on Monday, 06/07/19. Please call the office on Monday to confirm appointment.  Return to MAU as needed for emergencies.  Contact information: [place] [address] [phone]           Fatima Blank Certified Nurse-Midwife 06/05/2019  6:35 PM

## 2019-06-05 NOTE — MAU Note (Signed)
Brandi Hardy is a 39 y.o. here in MAU reporting: states she is [redacted] weeks pregnant. Bleeding since 0900, it started as spotting and is now heavier. Is wearing a pad and has changed it 1 time today states bleeding is less than a normal period for her. Also having some cramping. Has not been seen in the office yet. + UPT on Friday.  LMP: 05/02/19  Onset of complaint: today  Pain score: 5/10  Vitals:   06/05/19 1439  BP: 126/72  Pulse: 92  Resp: 16  Temp: 98.6 F (37 C)  SpO2: 99%     Lab orders placed from triage: UPT

## 2019-06-07 LAB — GC/CHLAMYDIA PROBE AMP (~~LOC~~) NOT AT ARMC
Chlamydia: NEGATIVE
Comment: NEGATIVE
Comment: NORMAL
Neisseria Gonorrhea: NEGATIVE

## 2019-06-07 LAB — ABO/RH: ABO/RH(D): A POS

## 2021-10-17 LAB — OB RESULTS CONSOLE ANTIBODY SCREEN: Antibody Screen: NEGATIVE

## 2021-10-17 LAB — OB RESULTS CONSOLE ABO/RH: RH Type: POSITIVE

## 2021-10-17 LAB — OB RESULTS CONSOLE RPR: RPR: NONREACTIVE

## 2021-10-17 LAB — HEPATITIS C ANTIBODY: HCV Ab: NEGATIVE

## 2021-10-17 LAB — OB RESULTS CONSOLE RUBELLA ANTIBODY, IGM: Rubella: IMMUNE

## 2021-10-17 LAB — OB RESULTS CONSOLE HIV ANTIBODY (ROUTINE TESTING): HIV: NONREACTIVE

## 2021-10-17 LAB — OB RESULTS CONSOLE HEPATITIS B SURFACE ANTIGEN: Hepatitis B Surface Ag: NEGATIVE

## 2021-11-02 LAB — OB RESULTS CONSOLE GC/CHLAMYDIA
Chlamydia: NEGATIVE
Neisseria Gonorrhea: NEGATIVE

## 2021-11-30 ENCOUNTER — Inpatient Hospital Stay (HOSPITAL_COMMUNITY)
Admission: AD | Admit: 2021-11-30 | Discharge: 2021-11-30 | Disposition: A | Discharge status: Home / Self Care | Payer: 59 | Attending: Obstetrics and Gynecology | Admitting: Obstetrics and Gynecology

## 2021-11-30 ENCOUNTER — Inpatient Hospital Stay (HOSPITAL_BASED_OUTPATIENT_CLINIC_OR_DEPARTMENT_OTHER): Payer: 59

## 2021-11-30 ENCOUNTER — Encounter (HOSPITAL_COMMUNITY): Payer: Self-pay | Admitting: *Deleted

## 2021-11-30 DIAGNOSIS — O09522 Supervision of elderly multigravida, second trimester: Secondary | ICD-10-CM | POA: Insufficient documentation

## 2021-11-30 DIAGNOSIS — O4692 Antepartum hemorrhage, unspecified, second trimester: Secondary | ICD-10-CM

## 2021-11-30 DIAGNOSIS — O3412 Maternal care for benign tumor of corpus uteri, second trimester: Secondary | ICD-10-CM | POA: Diagnosis not present

## 2021-11-30 DIAGNOSIS — B9689 Other specified bacterial agents as the cause of diseases classified elsewhere: Secondary | ICD-10-CM | POA: Insufficient documentation

## 2021-11-30 DIAGNOSIS — O23592 Infection of other part of genital tract in pregnancy, second trimester: Secondary | ICD-10-CM | POA: Insufficient documentation

## 2021-11-30 DIAGNOSIS — O99012 Anemia complicating pregnancy, second trimester: Secondary | ICD-10-CM | POA: Insufficient documentation

## 2021-11-30 DIAGNOSIS — O34219 Maternal care for unspecified type scar from previous cesarean delivery: Secondary | ICD-10-CM | POA: Diagnosis not present

## 2021-11-30 DIAGNOSIS — D259 Leiomyoma of uterus, unspecified: Secondary | ICD-10-CM

## 2021-11-30 DIAGNOSIS — Z3A14 14 weeks gestation of pregnancy: Secondary | ICD-10-CM | POA: Diagnosis not present

## 2021-11-30 DIAGNOSIS — Z3492 Encounter for supervision of normal pregnancy, unspecified, second trimester: Secondary | ICD-10-CM

## 2021-11-30 LAB — CBC
HCT: 29.7 % — ABNORMAL LOW (ref 36.0–46.0)
Hemoglobin: 9.8 g/dL — ABNORMAL LOW (ref 12.0–15.0)
MCH: 26.5 pg (ref 26.0–34.0)
MCHC: 33 g/dL (ref 30.0–36.0)
MCV: 80.3 fL (ref 80.0–100.0)
Platelets: 194 10*3/uL (ref 150–400)
RBC: 3.7 MIL/uL — ABNORMAL LOW (ref 3.87–5.11)
RDW: 14.1 % (ref 11.5–15.5)
WBC: 7 10*3/uL (ref 4.0–10.5)
nRBC: 0 % (ref 0.0–0.2)

## 2021-11-30 LAB — GC/CHLAMYDIA PROBE AMP (~~LOC~~) NOT AT ARMC
Chlamydia: NEGATIVE
Comment: NEGATIVE
Comment: NORMAL
Neisseria Gonorrhea: NEGATIVE

## 2021-11-30 LAB — URINALYSIS, ROUTINE W REFLEX MICROSCOPIC
Bacteria, UA: NONE SEEN
Bilirubin Urine: NEGATIVE
Glucose, UA: NEGATIVE mg/dL
Ketones, ur: NEGATIVE mg/dL
Leukocytes,Ua: NEGATIVE
Nitrite: NEGATIVE
Protein, ur: NEGATIVE mg/dL
Specific Gravity, Urine: 1.013 (ref 1.005–1.030)
pH: 7 (ref 5.0–8.0)

## 2021-11-30 LAB — WET PREP, GENITAL
Sperm: NONE SEEN
Trich, Wet Prep: NONE SEEN
WBC, Wet Prep HPF POC: >=10 — AB (ref <10)
Yeast Wet Prep HPF POC: NONE SEEN

## 2021-11-30 MED ORDER — METRONIDAZOLE 500 MG PO TABS: 500.0000 mg | ORAL_TABLET | Freq: Two times a day (BID) | ORAL | 0 refills | Status: DC

## 2021-11-30 MED ORDER — DOCUSATE SODIUM 100 MG PO CAPS: 100.0000 mg | ORAL_CAPSULE | Freq: Two times a day (BID) | ORAL | 2 refills | Status: DC | PRN

## 2021-11-30 MED ORDER — POLYSACCHARIDE IRON COMPLEX 150 MG PO CAPS: 150.0000 mg | ORAL_CAPSULE | ORAL | 0 refills | Status: DC

## 2021-11-30 NOTE — MAU Provider Note (Signed)
History     CSN: [id number]  Arrival date and time: 11/30/21 0541   Event Date/Time   First Provider Initiated Contact with Patient 11/30/21 [phone]      Chief Complaint  Patient presents with   Abdominal Pain   Vaginal Bleeding   HPI Maydelin Deming is a 41 y.o. G4P1011 at 19w3dwho presents to MAU with chief complaint of pink-tinged discharge. This is a new problem, onset at 0400 today. She states the color was pale pink but she was concerned by the large volume of fluid she visualized. She denies bright red bleeding. She is not seeing clots. She denies weakness, dizziness, syncope. She is remote from sexual intercourse.  Patient also c/p lower abdominal "crampy" discomfort. Onset coincides with onset of discharge. Pain score is 1/10. She has not taken medication for this complaint. She declines pain medication during MAU encounter.  Patient has established care with [facility]. Her pregnancy complications include AMA, fibroids, hx GHTN, hx cesarean delivery.  OB History     Gravida  4   Para  1   Term  1   Preterm  0   AB  1   Living  1      SAB  1   IAB  0   Ectopic  0   Multiple  0   Live Births  1           Past Medical History:  Diagnosis Date   Fibroid    GERD (gastroesophageal reflux disease)    Malaria 1988    Past Surgical History:  Procedure Laterality Date   CESAREAN SECTION N/A 03/28/2017   Procedure: CESAREAN SECTION;  Surgeon: GDian Queen MD;  [place]  Service: Obstetrics;  Laterality: N/A;   NO PAST SURGERIES      Family History  Problem Relation Age of Onset   Parkinson's disease Father     Social History   Tobacco Use   Smoking status: Never   Smokeless tobacco: Never  Vaping Use   Vaping Use: Never used  Substance Use Topics   Alcohol use: Not Currently    Comment: occasional; not while preg   Drug use: No    Allergies: No Known Allergies  Medications Prior to Admission   Medication Sig Dispense Refill Last Dose   Prenatal Vit-Fe Fumarate-FA (PRENATAL MULTIVITAMIN) TABS tablet Take 1 tablet by mouth daily at 12 noon.   11/29/2021   ferrous sulfate 325 (65 FE) MG tablet Take 1 tablet (325 mg total) by mouth daily with breakfast. (Patient not taking: Reported on 11/30/2021) 30 tablet 3 Not Taking    Review of Systems  Gastrointestinal:  Positive for abdominal pain.  Genitourinary:  Positive for vaginal bleeding.  All other systems reviewed and are negative.  Physical Exam   Blood pressure 119/63, pulse 86, temperature 98.4 F (36.9 C), resp. rate 17, height '5\' 7"'$  (1.702 m), weight 81.6 kg, SpO2 100 %, unknown if currently breastfeeding.  Physical Exam Vitals and nursing note reviewed. Exam conducted with a chaperone present.  Constitutional:      Appearance: She is well-developed. She is not ill-appearing.  Cardiovascular:     Rate and Rhythm: Normal rate and regular rhythm.     Heart sounds: Normal heart sounds.  Pulmonary:     Effort: Pulmonary effort is normal.     Breath sounds: Normal breath sounds.  Abdominal:     Palpations: Abdomen is soft.     Tenderness: There  is no abdominal tenderness.  Skin:    Capillary Refill: Capillary refill takes less than 2 seconds.  Neurological:     Mental Status: She is alert and oriented to person, place, and time.  Psychiatric:        Mood and Affect: Mood normal.        Behavior: Behavior normal.     MAU Course  Procedures  MDM  --Prenatal records visible in media tab. Hgb 10.2 at [facility] on 10/17/2021 --Prelim [place] report visible in media tab: normal fluid, anterior placenta, normal cervical length --Fibroids per [place]:  -->Anterior LT : 5.36 x 5.61 x 4.18 -->Anterior MID: 2.86 x 2.31 x 2.02   Orders Placed This Encounter  Procedures   Wet prep, genital   [place] MFM OB Limited   Urinalysis, Routine w reflex microscopic Urine, Clean Catch   CBC   Nursing communication   Patient Vitals for the past  24 hrs:  BP Temp Pulse Resp SpO2 Height Weight  11/30/21 0727 121/73 -- 82 -- -- -- --  11/30/21 0615 119/63 -- 86 -- -- -- --  11/30/21 0555 122/74 -- -- -- -- -- --  11/30/21 0554 -- 98.4 F (36.9 C) 88 17 100 % '5\' 7"'$  (1.702 m) 81.6 kg   Results for orders placed or performed during the hospital encounter of 11/30/21 (from the past 24 hour(s))  Urinalysis, Routine w reflex microscopic Urine, Clean Catch     Status: Abnormal   Collection Time: 11/30/21  6:10 AM  Result Value Ref Range   Color, Urine YELLOW YELLOW   APPearance CLEAR CLEAR   Specific Gravity, Urine 1.013 1.005 - 1.030   pH 7.0 5.0 - 8.0   Glucose, UA NEGATIVE NEGATIVE mg/dL   Hgb urine dipstick SMALL (A) NEGATIVE   Bilirubin Urine NEGATIVE NEGATIVE   Ketones, ur NEGATIVE NEGATIVE mg/dL   Protein, ur NEGATIVE NEGATIVE mg/dL   Nitrite NEGATIVE NEGATIVE   Leukocytes,Ua NEGATIVE NEGATIVE   RBC / HPF 0-5 0 - 5 RBC/hpf   WBC, UA 0-5 0 - 5 WBC/hpf   Bacteria, UA NONE SEEN NONE SEEN   Squamous Epithelial / LPF 0-5 0 - 5   Mucus PRESENT   CBC     Status: Abnormal   Collection Time: 11/30/21  6:13 AM  Result Value Ref Range   WBC 7.0 4.0 - 10.5 K/uL   RBC 3.70 (L) 3.87 - 5.11 MIL/uL   Hemoglobin 9.8 (L) 12.0 - 15.0 g/dL   HCT 29.7 (L) 36.0 - 46.0 %   MCV 80.3 80.0 - 100.0 fL   MCH 26.5 26.0 - 34.0 pg   MCHC 33.0 30.0 - 36.0 g/dL   RDW 14.1 11.5 - 15.5 %   Platelets 194 150 - 400 K/uL   nRBC 0.0 0.0 - 0.2 %  Wet prep, genital     Status: Abnormal   Collection Time: 11/30/21  6:15 AM   Specimen: PATH Cytology Cervicovaginal Ancillary Only  Result Value Ref Range   Yeast Wet Prep HPF POC NONE SEEN NONE SEEN   Trich, Wet Prep NONE SEEN NONE SEEN   Clue Cells Wet Prep HPF POC PRESENT (A) NONE SEEN   WBC, Wet Prep HPF POC >=10 (A) <10   Sperm NONE SEEN    Meds ordered this encounter  Medications   iron polysaccharides (NIFEREX) 150 MG capsule    Sig: Take 1 capsule (150 mg total) by mouth every other day.     Dispense:  15 capsule    Refill:  0    Order Specific Question:   Supervising Provider    Answer:   Radene Gunning [[id number]]   docusate sodium (COLACE) 100 MG capsule    Sig: Take 1 capsule (100 mg total) by mouth 2 (two) times daily as needed.    Dispense:  30 capsule    Refill:  2    Order Specific Question:   Supervising Provider    Answer:   Llana Aliment   metroNIDAZOLE (FLAGYL) 500 MG tablet    Sig: Take 1 tablet (500 mg total) by mouth 2 (two) times daily.    Dispense:  14 tablet    Refill:  0    Order Specific Question:   Supervising Provider    Answer:   Radene Gunning [[id number]]   Assessment and Plan  --41 y.o. G4P1011 at [redacted]w[redacted]d --[id number] by Doppler --No acute findings on preliminary [place]--Uterine fibroids --Bacterial Vaginosis --Anemia in pregnancy, begin PO Fe every other day --Rh POS --Discharge home in stable condition with second trimester precautions  F/U: Patient has an OB appointment at 136today  SDarlina Rumpf MAvis MSN, CNM 11/30/2021, 7:49 AM

## 2021-11-30 NOTE — Discharge Instructions (Signed)

## 2021-11-30 NOTE — MAU Note (Addendum)
.  Brandi Hardy is a 41 y.o. at 33w3dhere in MAU reporting vaginal bleeding and mild cramping first noted at 0430. Bleeding is pink but a lot. Was treated for yeast about 3wks ago  Onset of complaint: 0430 Pain score: 1 Vitals:   11/30/21 0554 11/30/21 0555  BP:  122/74  Pulse: 88   Resp: 17   Temp: 98.4 F (36.9 C)   SpO2: 100%      FHT:123 Lab orders placed from triage:   U/a

## 2022-05-13 ENCOUNTER — Encounter (HOSPITAL_COMMUNITY): Payer: Self-pay

## 2022-05-13 ENCOUNTER — Telehealth (HOSPITAL_COMMUNITY): Payer: Self-pay | Admitting: *Deleted

## 2022-05-13 NOTE — Telephone Encounter (Signed)
Preadmission screen  

## 2022-05-13 NOTE — Patient Instructions (Signed)
Brandi Hardy  05/13/2022   Your procedure is scheduled on:  05/24/2022  Arrive at 1100 at Entrance C on [address] at [facility]  and [employer]. You are invited to use the FREE valet parking or use the Visitor's parking deck.  Pick up the phone at the desk and dial [phone].  Call this number if you have problems the morning of surgery: [phone]  Remember:   Do not eat food:(After Midnight) Desps de medianoche.  Do not drink clear liquids: (4 Hours before arrival) 4 horas ante llegada.0700  Take these medicines the morning of surgery with A SIP OF WATER:  none   Do not wear jewelry, make-up or nail polish.  Do not wear lotions, powders, or perfumes. Do not wear deodorant.  Do not shave 48 hours prior to surgery.  Do not bring valuables to the hospital.  [facility] is not   responsible for any belongings or valuables brought to the hospital.  Contacts, dentures or bridgework may not be worn into surgery.  Leave suitcase in the car. After surgery it may be brought to your room.  For patients admitted to the hospital, checkout time is 11:00 AM the day of              discharge.      Please read over the following fact sheets that you were given:     Preparing for Surgery

## 2022-05-14 ENCOUNTER — Encounter (HOSPITAL_COMMUNITY): Payer: Self-pay

## 2022-05-19 ENCOUNTER — Encounter (HOSPITAL_COMMUNITY): Payer: Self-pay | Admitting: Obstetrics and Gynecology

## 2022-05-19 ENCOUNTER — Inpatient Hospital Stay (HOSPITAL_COMMUNITY): Payer: 59 | Admitting: Anesthesiology

## 2022-05-19 ENCOUNTER — Encounter (HOSPITAL_COMMUNITY)
Admission: RE | Disposition: A | Discharge status: Home / Self Care | Payer: Self-pay | Source: Home / Self Care | Attending: Obstetrics and Gynecology

## 2022-05-19 ENCOUNTER — Inpatient Hospital Stay (HOSPITAL_COMMUNITY)
Admission: RE | Admit: 2022-05-19 | Discharge: 2022-05-21 | DRG: 787 | Disposition: A | Discharge status: Home / Self Care | Payer: 59 | Attending: Obstetrics and Gynecology | Admitting: Obstetrics and Gynecology

## 2022-05-19 ENCOUNTER — Other Ambulatory Visit: Payer: Self-pay

## 2022-05-19 DIAGNOSIS — D259 Leiomyoma of uterus, unspecified: Secondary | ICD-10-CM | POA: Diagnosis present

## 2022-05-19 DIAGNOSIS — O9902 Anemia complicating childbirth: Secondary | ICD-10-CM | POA: Diagnosis present

## 2022-05-19 DIAGNOSIS — Z3A38 38 weeks gestation of pregnancy: Secondary | ICD-10-CM

## 2022-05-19 DIAGNOSIS — O3413 Maternal care for benign tumor of corpus uteri, third trimester: Secondary | ICD-10-CM | POA: Diagnosis present

## 2022-05-19 DIAGNOSIS — O99214 Obesity complicating childbirth: Secondary | ICD-10-CM | POA: Diagnosis present

## 2022-05-19 DIAGNOSIS — Z8613 Personal history of malaria: Secondary | ICD-10-CM

## 2022-05-19 DIAGNOSIS — O34219 Maternal care for unspecified type scar from previous cesarean delivery: Secondary | ICD-10-CM

## 2022-05-19 DIAGNOSIS — D62 Acute posthemorrhagic anemia: Secondary | ICD-10-CM | POA: Diagnosis not present

## 2022-05-19 DIAGNOSIS — D649 Anemia, unspecified: Secondary | ICD-10-CM | POA: Diagnosis not present

## 2022-05-19 DIAGNOSIS — O34211 Maternal care for low transverse scar from previous cesarean delivery: Principal | ICD-10-CM | POA: Diagnosis present

## 2022-05-19 DIAGNOSIS — Z98891 History of uterine scar from previous surgery: Secondary | ICD-10-CM

## 2022-05-19 LAB — CBC
HCT: 35.3 % — ABNORMAL LOW (ref 36.0–46.0)
Hemoglobin: 11.5 g/dL — ABNORMAL LOW (ref 12.0–15.0)
MCH: 28.3 pg (ref 26.0–34.0)
MCHC: 32.6 g/dL (ref 30.0–36.0)
MCV: 86.7 fL (ref 80.0–100.0)
Platelets: 223 10*3/uL (ref 150–400)
RBC: 4.07 MIL/uL (ref 3.87–5.11)
RDW: 14.6 % (ref 11.5–15.5)
WBC: 6.9 10*3/uL (ref 4.0–10.5)
nRBC: 0 % (ref 0.0–0.2)

## 2022-05-19 LAB — TYPE AND SCREEN
ABO/RH(D): A POS
Antibody Screen: NEGATIVE

## 2022-05-19 LAB — POCT FERN TEST: POCT Fern Test: NEGATIVE

## 2022-05-19 SURGERY — Surgical Case
Anesthesia: Spinal

## 2022-05-19 MED ORDER — SODIUM CHLORIDE 0.9 % IV SOLN
2.0000 g | INTRAVENOUS | Status: AC
  Administered 2022-05-19: 2 g via INTRAVENOUS
  Filled 2022-05-19: qty 2

## 2022-05-19 MED ORDER — ONDANSETRON HCL 4 MG/2ML IJ SOLN
INTRAMUSCULAR | Status: DC | PRN
  Administered 2022-05-19: 4 mg via INTRAVENOUS

## 2022-05-19 MED ORDER — POVIDONE-IODINE 10 % EX SWAB
2.0000 | Freq: Once | CUTANEOUS | Status: AC
  Administered 2022-05-19: 2 via TOPICAL

## 2022-05-19 MED ORDER — ONDANSETRON HCL 4 MG/2ML IJ SOLN: 4.0000 mg | Freq: Once | INTRAMUSCULAR | Status: DC | PRN

## 2022-05-19 MED ORDER — ONDANSETRON HCL 4 MG/2ML IJ SOLN
INTRAMUSCULAR | Status: AC
  Filled 2022-05-19: qty 2

## 2022-05-19 MED ORDER — NALOXONE HCL 4 MG/10ML IJ SOLN: 1.0000 ug/kg/h | INTRAVENOUS | Status: DC | PRN

## 2022-05-19 MED ORDER — PHENYLEPHRINE HCL-NACL 20-0.9 MG/250ML-% IV SOLN
INTRAVENOUS | Status: AC
  Filled 2022-05-19: qty 250

## 2022-05-19 MED ORDER — LACTATED RINGERS IV SOLN: INTRAVENOUS | Status: DC | PRN

## 2022-05-19 MED ORDER — MORPHINE SULFATE (PF) 0.5 MG/ML IJ SOLN
INTRAMUSCULAR | Status: AC
  Filled 2022-05-19: qty 10

## 2022-05-19 MED ORDER — ONDANSETRON HCL 4 MG/2ML IJ SOLN: 4.0000 mg | Freq: Three times a day (TID) | INTRAMUSCULAR | Status: DC | PRN

## 2022-05-19 MED ORDER — AMISULPRIDE (ANTIEMETIC) 5 MG/2ML IV SOLN: 10.0000 mg | Freq: Once | INTRAVENOUS | Status: DC | PRN

## 2022-05-19 MED ORDER — MEPERIDINE HCL 25 MG/ML IJ SOLN: 6.2500 mg | INTRAMUSCULAR | Status: DC | PRN

## 2022-05-19 MED ORDER — KETOROLAC TROMETHAMINE 30 MG/ML IJ SOLN: 30.0000 mg | Freq: Four times a day (QID) | INTRAMUSCULAR | Status: DC | PRN

## 2022-05-19 MED ORDER — HYDROMORPHONE HCL 1 MG/ML IJ SOLN: 0.2500 mg | INTRAMUSCULAR | Status: DC | PRN

## 2022-05-19 MED ORDER — FENTANYL CITRATE (PF) 100 MCG/2ML IJ SOLN
INTRAMUSCULAR | Status: AC
  Filled 2022-05-19: qty 2

## 2022-05-19 MED ORDER — NALOXONE HCL 0.4 MG/ML IJ SOLN: 0.4000 mg | INTRAMUSCULAR | Status: DC | PRN

## 2022-05-19 MED ORDER — KETOROLAC TROMETHAMINE 30 MG/ML IJ SOLN
INTRAMUSCULAR | Status: AC
  Filled 2022-05-19: qty 1

## 2022-05-19 MED ORDER — DEXAMETHASONE SODIUM PHOSPHATE 10 MG/ML IJ SOLN
INTRAMUSCULAR | Status: AC
  Filled 2022-05-19: qty 1

## 2022-05-19 MED ORDER — SODIUM CHLORIDE 0.9 % IV SOLN
2.0000 g | Freq: Once | INTRAVENOUS | Status: DC
  Filled 2022-05-19: qty 2

## 2022-05-19 MED ORDER — OXYTOCIN-SODIUM CHLORIDE 30-0.9 UT/500ML-% IV SOLN
INTRAVENOUS | Status: DC | PRN
  Administered 2022-05-19: 300 mL via INTRAVENOUS

## 2022-05-19 MED ORDER — SCOPOLAMINE 1 MG/3DAYS TD PT72
1.0000 | MEDICATED_PATCH | Freq: Once | TRANSDERMAL | Status: DC
  Administered 2022-05-19: 1.5 mg via TRANSDERMAL

## 2022-05-19 MED ORDER — FENTANYL CITRATE (PF) 100 MCG/2ML IJ SOLN
INTRAMUSCULAR | Status: DC | PRN
  Administered 2022-05-19: 15 ug via INTRATHECAL

## 2022-05-19 MED ORDER — SODIUM CHLORIDE 0.9 % IR SOLN
Status: DC | PRN
  Administered 2022-05-19: 1

## 2022-05-19 MED ORDER — DIPHENHYDRAMINE HCL 50 MG/ML IJ SOLN: 12.5000 mg | INTRAMUSCULAR | Status: DC | PRN

## 2022-05-19 MED ORDER — DIPHENHYDRAMINE HCL 25 MG PO CAPS: 25.0000 mg | ORAL_CAPSULE | ORAL | Status: DC | PRN

## 2022-05-19 MED ORDER — SCOPOLAMINE 1 MG/3DAYS TD PT72
MEDICATED_PATCH | TRANSDERMAL | Status: AC
  Filled 2022-05-19: qty 1

## 2022-05-19 MED ORDER — SODIUM CHLORIDE 0.9% FLUSH: 3.0000 mL | INTRAVENOUS | Status: DC | PRN

## 2022-05-19 MED ORDER — OXYCODONE HCL 5 MG PO TABS: 5.0000 mg | ORAL_TABLET | Freq: Once | ORAL | Status: DC | PRN

## 2022-05-19 MED ORDER — SOD CITRATE-CITRIC ACID 500-334 MG/5ML PO SOLN
30.0000 mL | Freq: Once | ORAL | Status: AC
  Administered 2022-05-19: 30 mL via ORAL
  Filled 2022-05-19: qty 30

## 2022-05-19 MED ORDER — OXYTOCIN-SODIUM CHLORIDE 30-0.9 UT/500ML-% IV SOLN
INTRAVENOUS | Status: AC
  Filled 2022-05-19: qty 500

## 2022-05-19 MED ORDER — DEXAMETHASONE SODIUM PHOSPHATE 10 MG/ML IJ SOLN
INTRAMUSCULAR | Status: DC | PRN
  Administered 2022-05-19: 10 mg via INTRAVENOUS

## 2022-05-19 MED ORDER — BUPIVACAINE IN DEXTROSE 0.75-8.25 % IT SOLN
INTRATHECAL | Status: DC | PRN
  Administered 2022-05-19: 1.6 mL via INTRATHECAL

## 2022-05-19 MED ORDER — STERILE WATER FOR IRRIGATION IR SOLN
Status: DC | PRN
  Administered 2022-05-19: 1000 mL

## 2022-05-19 MED ORDER — MORPHINE SULFATE (PF) 0.5 MG/ML IJ SOLN
INTRAMUSCULAR | Status: DC | PRN
  Administered 2022-05-19: 150 ug via INTRATHECAL

## 2022-05-19 MED ORDER — LACTATED RINGERS IV BOLUS
1000.0000 mL | Freq: Once | INTRAVENOUS | Status: AC
  Administered 2022-05-19: 1000 mL via INTRAVENOUS

## 2022-05-19 MED ORDER — ACETAMINOPHEN 500 MG PO TABS
1000.0000 mg | ORAL_TABLET | Freq: Four times a day (QID) | ORAL | Status: AC
  Administered 2022-05-20 (×4): 1000 mg via ORAL
  Filled 2022-05-19 (×4): qty 2

## 2022-05-19 MED ORDER — OXYCODONE HCL 5 MG/5ML PO SOLN: 5.0000 mg | Freq: Once | ORAL | Status: DC | PRN

## 2022-05-19 MED ORDER — PHENYLEPHRINE HCL-NACL 20-0.9 MG/250ML-% IV SOLN
INTRAVENOUS | Status: DC | PRN
  Administered 2022-05-19: 60 ug/min via INTRAVENOUS

## 2022-05-19 MED ORDER — KETOROLAC TROMETHAMINE 30 MG/ML IJ SOLN
30.0000 mg | Freq: Once | INTRAMUSCULAR | Status: AC | PRN
  Administered 2022-05-19: 30 mg via INTRAVENOUS

## 2022-05-19 SURGICAL SUPPLY — 33 items
BARRIER ADHS 3X4 INTERCEED (GAUZE/BANDAGES/DRESSINGS) IMPLANT
BENZOIN TINCTURE PRP APPL 2/3 (GAUZE/BANDAGES/DRESSINGS) IMPLANT
CHLORAPREP W/TINT 26 (MISCELLANEOUS) ×2 IMPLANT
CLAMP UMBILICAL CORD (MISCELLANEOUS) ×1 IMPLANT
CLOTH BEACON ORANGE TIMEOUT ST (SAFETY) ×1 IMPLANT
DRSG OPSITE POSTOP 4X10 (GAUZE/BANDAGES/DRESSINGS) ×1 IMPLANT
ELECT REM PT RETURN 9FT ADLT (ELECTROSURGICAL) ×1
ELECTRODE REM PT RTRN 9FT ADLT (ELECTROSURGICAL) ×1 IMPLANT
EXTRACTOR VACUUM M CUP 4 TUBE (SUCTIONS) IMPLANT
GAUZE SPONGE 4X4 12PLY STRL LF (GAUZE/BANDAGES/DRESSINGS) IMPLANT
GLOVE BIO SURGEON STRL SZ 6.5 (GLOVE) ×1 IMPLANT
GLOVE BIOGEL PI IND STRL 7.0 (GLOVE) ×1 IMPLANT
GOWN STRL REUS W/TWL LRG LVL3 (GOWN DISPOSABLE) ×2 IMPLANT
KIT ABG SYR 3ML LUER SLIP (SYRINGE) IMPLANT
NDL HYPO 25X5/8 SAFETYGLIDE (NEEDLE) ×1 IMPLANT
NEEDLE HYPO 22GX1.5 SAFETY (NEEDLE) IMPLANT
NEEDLE HYPO 25X5/8 SAFETYGLIDE (NEEDLE) ×1 IMPLANT
NS IRRIG 1000ML POUR BTL (IV SOLUTION) ×1 IMPLANT
PACK C SECTION WH (CUSTOM PROCEDURE TRAY) ×1 IMPLANT
PAD ABD 7.5X8 STRL (GAUZE/BANDAGES/DRESSINGS) IMPLANT
PAD OB MATERNITY 4.3X12.25 (PERSONAL CARE ITEMS) ×1 IMPLANT
STRIP CLOSURE SKIN 1/2X4 (GAUZE/BANDAGES/DRESSINGS) IMPLANT
SUT CHROMIC 0 CTX 36 (SUTURE) ×2 IMPLANT
SUT PLAIN 0 NONE (SUTURE) IMPLANT
SUT PLAIN 2 0 XLH (SUTURE) IMPLANT
SUT VIC AB 0 CT1 27 (SUTURE) ×3
SUT VIC AB 0 CT1 27XBRD ANBCTR (SUTURE) ×3 IMPLANT
SUT VIC AB 4-0 KS 27 (SUTURE) IMPLANT
SYR CONTROL 10ML LL (SYRINGE) IMPLANT
TOWEL OR 17X24 6PK STRL BLUE (TOWEL DISPOSABLE) ×1 IMPLANT
TRAY FOLEY W/BAG SLVR 14FR LF (SET/KITS/TRAYS/PACK) ×1 IMPLANT
VACUUM CUP M-STYLE MYSTIC II (SUCTIONS) IMPLANT
WATER STERILE IRR 1000ML POUR (IV SOLUTION) ×1 IMPLANT

## 2022-05-19 NOTE — MAU Provider Note (Signed)
Chief Complaint  Patient presents with   Contractions   Rupture of Membranes   Vaginal Discharge     Event Date/Time   First Provider Initiated Contact with Patient 05/19/22 1530      S: Brandi Hardy  is a 42 y.o. y.o. year old G8P1021 female at 32w5dweeks gestation who presents to MAU reporting contractions and leaking of clear fluid and mucus since this morning. Previous C/S for fetal indications. Plans repeat for 7 cm LUS fibroid.     Contractions: regular mod-strong Vaginal bleeding: Denies Fetal movement: Nml   O: Patient Vitals for the past 24 hrs:  BP Temp Temp src Pulse Resp SpO2  05/19/22 1420 137/74 98.6 F (37 C) Oral 87 18 97 %   General: NAD Heart: Regular rate Lungs: Normal rate and effort Abd: Soft, NT, Gravid, S=D Pelvic: NEFG, Neg pooling, no blood. Moderate amount of mucus.  Dilation: 1.5 Effacement (%): 0 Station: -3 Exam by:: PNurse, children's EFM: 140, Min variability, 10x10 accelerations, no decelerations Toco: Q2-7, mod-strong  Neg Fern  MAU Course/MDM Pt contracting uncomfortably, no evidence of SROM, Prior C/S with plans for repeat. Dr. LCorinna Capraconcerned about possibly more complicated C/S due to large LUS fibroid.   FHR Cat II with Minimal FHR variability and few accels. Variability improved with IV bolus, positions changes but still Cat II. MD aware.   Cervical change to Dilation: 3.5/80/-2 and contractions closer and stronger C/W active labor. Discussed with Dr. LCorinna Capra Will proceed with repeat C/S. Prep OR.   A: [id number] IUP 1. Labor and delivery indication for care or intervention   2. Previous cesarean delivery affecting pregnancy, antepartum   3. Uterine fibroids affecting pregnancy in third trimester     P: Prep for OR. Dr. LoCorinna Caprassuming care of pt.   SmTamala [place]/21/2024 3:30 PM  2

## 2022-05-19 NOTE — Transfer of Care (Signed)
Immediate Anesthesia Transfer of Care Note  Patient: Brandi Hardy  Procedure(s) Performed: REPEAT CESAREAN SECTION EDC: 05-28-22  ALLERG: NKDA  PREVIOUS X 1  Patient Location: PACU  Anesthesia Type:Spinal  Level of Consciousness: awake  Airway & Oxygen Therapy: Patient Spontanous Breathing  Post-op Assessment: Report given to RN and Post -op Vital signs reviewed and stable  Post vital signs: Reviewed and stable  Last Vitals:  Vitals Value Taken Time  BP 122/53 05/19/22 1956  Temp    Pulse 88 05/19/22 1959  Resp 12 05/19/22 1959  SpO2 97 % 05/19/22 1959  Vitals shown include unvalidated device data.  Last Pain:  Vitals:   05/19/22 1422  TempSrc:   PainSc: 6       Patients Stated Pain Goal: 0 (37/02/30 1720)  Complications: No notable events documented.

## 2022-05-19 NOTE — Op Note (Signed)
Cesarean Section Procedure Note  Pre-operative Diagnosis: IUP at 38 weeks, nonreassuring fetal monitoring, labor, previous cesarean section for repeat, fibroids  Post-operative Diagnosis: same plus moderate meconium  Surgeon: Luz Lex   Assistants: Dr Glennie Isle, MD An experienced assistant was required given the standard of surgical care given the complexity of the case.  This assistant was needed for exposure, dissection, suctioning, retraction, instrument exchange, assisting with delivery with administration of fundal pressure, and for overall help during the procedure  Anesthesia: spinal  Procedure:  Low Segment Transverse cesarean section  Procedure Details  The patient was seen in the Holding Room. The risks, benefits, complications, treatment options, and expected outcomes were discussed with the patient.  The patient concurred with the proposed plan, giving informed consent.  The site of surgery properly noted/marked.. A Time Out was held and the above information confirmed.  After induction of anesthesia, the patient was draped and prepped in the usual sterile manner. A Pfannenstiel incision was made and carried down through the subcutaneous tissue to the fascia. Fascial incision was made and extended transversely. The fascia was separated from the underlying rectus tissue superiorly and inferiorly. The peritoneum was identified and entered. Peritoneal incision was extended longitudinally. The utero-vesical peritoneal reflection was incised transversely and the bladder flap was bluntly freed from the lower uterine segment. A low transverse uterine incision was made. Delivered from vertex presentation was a baby with Apgar scores of 8 at one minute and 9 at five minutes. After the umbilical cord was clamped and cut cord blood was obtained for evaluation. The placenta was removed intact and appeared normal. The uterine outline, tubes and ovaries appeared normal. The uterine  incision was closed with running locked sutures of 0 monocryl and imbricated with 0 monocryl. Hemostasis was observed. Lavage was carried out until clear. The peritoneum was then closed with 0 monocryl and rectus muscles plicated in the midline.  After hemostasis was assured, the fascia was then reapproximated with running sutures of 0 Vicryl. Irrigation was applied and after adequate hemostasis was assured, the skin was reapproximated with subcutaneous sutures using 4-0 monocryl.  Instrument, sponge, and needle counts were correct prior the abdominal closure and at the conclusion of the case. The patient received 2 grams cefotetan preoperatively.  Findings: Viable female  Estimated Blood Loss:  450cc         Specimens: Placenta was sent to labor and delivery         Complications:  None

## 2022-05-19 NOTE — MAU Note (Signed)
.  Brandi Hardy is a 42 y.o. at 48w5dhere in MAU reporting: ctx q 15 min since 0600 this morning.  Reports some LOF in small amounts as well as mucous like dc.  +FM denies vag bleeding   Onset of complaint: 0600 Pain score: 6 Vitals:   05/19/22 1420  BP: 137/74  Pulse: 87  Resp: 18  Temp: 98.6 F (37 C)  SpO2: 97%     FHT:150 Lab orders placed from triage:

## 2022-05-19 NOTE — H&P (Signed)
Brandi Hardy is a 42 y.o. female presenting for Labor symptoms, nonreassuring fetal heart tracing, and previous c/x for repeat.  Pregnancy complicated by AMA with normal NIPS, fibroids including a 7cm LUS anterior fibroid.   OB History     Gravida  5   Para  1   Term  1   Preterm  0   AB  2   Living  1      SAB  2   IAB  0   Ectopic  0   Multiple  0   Live Births  1          Past Medical History:  Diagnosis Date   Fibroid    GERD (gastroesophageal reflux disease)    Malaria 1988   Past Surgical History:  Procedure Laterality Date   CESAREAN SECTION N/A 03/28/2017   Procedure: CESAREAN SECTION;  Surgeon: Dian Queen, MD;  Location: [place];  Service: Obstetrics;  Laterality: N/A;   NO PAST SURGERIES     Family History: family history includes Diabetes in her mother; Hypertension in her father; Parkinson's disease in her father; Stroke in her mother. Social History:  reports that she has never smoked. She has never used smokeless tobacco. She reports that she does not currently use alcohol. She reports that she does not use drugs.     Maternal Diabetes: No Genetic Screening: Normal Maternal Ultrasounds/Referrals: Normal Fetal Ultrasounds or other Referrals:  None Maternal Substance Abuse:  No Significant Maternal Medications:  None Significant Maternal Lab Results:  Group B Strep negative Number of Prenatal Visits:greater than 3 verified prenatal visits Other Comments:  None  Review of Systems History Dilation: 3.5 Effacement (%): 80 Station: -2 Exam by:: Smith CNM Blood pressure 128/75, pulse 82, temperature 98.6 F (37 C), temperature source Oral, resp. rate 16, SpO2 97 %, unknown if currently breastfeeding. Exam Physical Exam  \ Vitals and nursing note reviewed. Exam conducted with a chaperone present.  Constitutional:      Appearance: Normal appearance.  HENT:     Head: Normocephalic.  Eyes:     Pupils: Pupils are  equal, round, and reactive to light.  Cardiovascular:     Rate and Rhythm: Normal rate and regular rhythm.     Pulses: Normal pulses.  Abdominal:     General: Abdomen is Gravid, nontender Neurological:     Mental Status: She is alert. Prenatal labs: ABO, Rh: --/--/PENDING (01/21 1730) Antibody: PENDING (01/21 1730) Rubella: Immune (06/21 0000) RPR: Nonreactive (06/21 0000)  HBsAg: Negative (06/21 0000)  HIV: Non-reactive (06/21 0000)  GBS:     Assessment/Plan: IUP at 38+ weeks Labor with previous c/s for repeat Nonreassuring FHR.  No decels but minimal variability Fibroids - She understands this may limit access to deliver the fetal vtx and could need classical C/S, or even hysterectomy. Risks and benefits of C/S were discussed.  All questions were answered and informed consent was obtained.  Plan to proceed with low segment transverse Cesarean Section.   Luz Lex 05/19/2022, 6:01 PM

## 2022-05-19 NOTE — Progress Notes (Signed)
[facility] MAU notified OR C ready for patient entrance.

## 2022-05-19 NOTE — Anesthesia Preprocedure Evaluation (Addendum)
Anesthesia Evaluation  Patient identified by MRN, date of birth, ID band Patient awake    Reviewed: Allergy & Precautions, Patient's Chart, lab work & pertinent test results  Airway Mallampati: III  TM Distance: >3 FB Neck ROM: Full    Dental no notable dental hx.    Pulmonary neg pulmonary ROS   Pulmonary exam normal breath sounds clear to auscultation       Cardiovascular negative cardio ROS Normal cardiovascular exam Rhythm:Regular Rate:Normal     Neuro/Psych negative neurological ROS  negative psych ROS   GI/Hepatic Neg liver ROS,GERD  Controlled,,  Endo/Other  Obesity BMI 34  Renal/GU negative Renal ROS  negative genitourinary   Musculoskeletal negative musculoskeletal ROS (+)    Abdominal  (+) + obese  Peds negative pediatric ROS (+)  Hematology  (+) Blood dyscrasia, anemia Hb 11.5, plt 223   Anesthesia Other Findings   Reproductive/Obstetrics (+) Pregnancy Prior section 2018 Known uterine fibroid, presented to MAU in labor                             Anesthesia Physical Anesthesia Plan  ASA: 3  Anesthesia Plan: Spinal   Post-op Pain Management: Toradol IV (intra-op)*, Ofirmev IV (intra-op)* and Regional block*   Induction:   PONV Risk Score and Plan: 2 and Treatment may vary due to age or medical condition, Ondansetron and Dexamethasone  Airway Management Planned: Natural Airway  Additional Equipment: None  Intra-op Plan:   Post-operative Plan:   Informed Consent: I have reviewed the patients History and Physical, chart, labs and discussed the procedure including the risks, benefits and alternatives for the proposed anesthesia with the patient or authorized representative who has indicated his/her understanding and acceptance.       Plan Discussed with: CRNA  Anesthesia Plan Comments:        Anesthesia Quick Evaluation

## 2022-05-20 LAB — RPR: RPR Ser Ql: NONREACTIVE

## 2022-05-20 LAB — CBC
HCT: 26.2 % — ABNORMAL LOW (ref 36.0–46.0)
Hemoglobin: 8.4 g/dL — ABNORMAL LOW (ref 12.0–15.0)
MCH: 27.7 pg (ref 26.0–34.0)
MCHC: 32.1 g/dL (ref 30.0–36.0)
MCV: 86.5 fL (ref 80.0–100.0)
Platelets: 196 10*3/uL (ref 150–400)
RBC: 3.03 MIL/uL — ABNORMAL LOW (ref 3.87–5.11)
RDW: 14.6 % (ref 11.5–15.5)
WBC: 9.4 10*3/uL (ref 4.0–10.5)
nRBC: 0 % (ref 0.0–0.2)

## 2022-05-20 MED ORDER — MEASLES, MUMPS & RUBELLA VAC IJ SOLR: 0.5000 mL | Freq: Once | INTRAMUSCULAR | Status: DC

## 2022-05-20 MED ORDER — FERROUS SULFATE 325 (65 FE) MG PO TABS
325.0000 mg | ORAL_TABLET | Freq: Every day | ORAL | Status: DC
  Administered 2022-05-20 – 2022-05-21 (×2): 325 mg via ORAL
  Filled 2022-05-20 (×2): qty 1

## 2022-05-20 MED ORDER — IBUPROFEN 600 MG PO TABS
600.0000 mg | ORAL_TABLET | Freq: Four times a day (QID) | ORAL | Status: DC
  Administered 2022-05-20 – 2022-05-21 (×6): 600 mg via ORAL
  Filled 2022-05-20 (×7): qty 1

## 2022-05-20 MED ORDER — LACTATED RINGERS IV SOLN: INTRAVENOUS | Status: DC

## 2022-05-20 MED ORDER — PRENATAL MULTIVITAMIN CH
1.0000 | ORAL_TABLET | Freq: Every day | ORAL | Status: DC
  Administered 2022-05-20 – 2022-05-21 (×2): 1 via ORAL
  Filled 2022-05-20 (×2): qty 1

## 2022-05-20 MED ORDER — SIMETHICONE 80 MG PO CHEW: 80.0000 mg | CHEWABLE_TABLET | ORAL | Status: DC | PRN

## 2022-05-20 MED ORDER — OXYCODONE-ACETAMINOPHEN 5-325 MG PO TABS: 1.0000 | ORAL_TABLET | ORAL | Status: DC | PRN

## 2022-05-20 MED ORDER — DIBUCAINE (PERIANAL) 1 % EX OINT: 1.0000 | TOPICAL_OINTMENT | CUTANEOUS | Status: DC | PRN

## 2022-05-20 MED ORDER — OXYTOCIN-SODIUM CHLORIDE 30-0.9 UT/500ML-% IV SOLN: 2.5000 [IU]/h | INTRAVENOUS | Status: AC

## 2022-05-20 MED ORDER — ZOLPIDEM TARTRATE 5 MG PO TABS: 5.0000 mg | ORAL_TABLET | Freq: Every evening | ORAL | Status: DC | PRN

## 2022-05-20 MED ORDER — TETANUS-DIPHTH-ACELL PERTUSSIS 5-2.5-18.5 LF-MCG/0.5 IM SUSY: 0.5000 mL | PREFILLED_SYRINGE | Freq: Once | INTRAMUSCULAR | Status: DC

## 2022-05-20 MED ORDER — SENNOSIDES-DOCUSATE SODIUM 8.6-50 MG PO TABS
2.0000 | ORAL_TABLET | ORAL | Status: DC
  Administered 2022-05-20 – 2022-05-21 (×2): 2 via ORAL
  Filled 2022-05-20 (×2): qty 2

## 2022-05-20 MED ORDER — MENTHOL 3 MG MT LOZG: 1.0000 | LOZENGE | OROMUCOSAL | Status: DC | PRN

## 2022-05-20 MED ORDER — DIPHENHYDRAMINE HCL 25 MG PO CAPS: 25.0000 mg | ORAL_CAPSULE | Freq: Four times a day (QID) | ORAL | Status: DC | PRN

## 2022-05-20 MED ORDER — WITCH HAZEL-GLYCERIN EX PADS: 1.0000 | MEDICATED_PAD | CUTANEOUS | Status: DC | PRN

## 2022-05-20 MED ORDER — COCONUT OIL OIL: 1.0000 | TOPICAL_OIL | Status: DC | PRN

## 2022-05-20 MED ORDER — SIMETHICONE 80 MG PO CHEW
80.0000 mg | CHEWABLE_TABLET | Freq: Three times a day (TID) | ORAL | Status: DC
  Administered 2022-05-20 – 2022-05-21 (×4): 80 mg via ORAL
  Filled 2022-05-20 (×5): qty 1

## 2022-05-20 MED ORDER — ACETAMINOPHEN 325 MG PO TABS
650.0000 mg | ORAL_TABLET | ORAL | Status: DC | PRN
  Administered 2022-05-21 (×2): 650 mg via ORAL
  Filled 2022-05-20 (×2): qty 2

## 2022-05-20 NOTE — Lactation Note (Signed)
This note was copied from a baby's chart. Lactation Consultation Note  Patient Name: Brandi Hardy [id number] Date: 05/20/2022 Reason for consult: Follow-up assessment Age:43 hours  P2, Reviewed hand expression and supply and demand. Baby demonstrated feeding cues.  Assisted mother in latching in football hold and baby sustained latch. Feed on demand with cues.  Goal 8-12+ times per day after first 24 hrs.  Place baby STS if not cueing.  Mom made aware of O/P services, breastfeeding support groups,  and our phone # for post-discharge questions.    Maternal Data Has patient been taught Hand Expression?: Yes Does the patient have breastfeeding experience prior to this delivery?: Yes How long did the patient breastfeed?: 2 mos.  Feeding Mother's Current Feeding Choice: Breast Milk and Formula Nipple Type: Slow - flow  LATCH Score Latch: Grasps breast easily, tongue down, lips flanged, rhythmical sucking.  Audible Swallowing: A few with stimulation  Type of Nipple: Everted at rest and after stimulation  Comfort (Breast/Nipple): Soft / non-tender  Hold (Positioning): Assistance needed to correctly position infant at breast and maintain latch.  LATCH Score: 8   Lactation Tools Discussed/Used    Interventions Interventions: Breast feeding basics reviewed;Assisted with latch;Hand express;Education;LC Services brochure  Discharge    Consult Status Consult Status: Follow-up Date: 05/21/22 Follow-up type: In-patient    Vivianne Master [facility] 05/20/2022, 9:28 AM

## 2022-05-20 NOTE — Anesthesia Postprocedure Evaluation (Signed)
Anesthesia Post Note  Patient: Brandi Hardy  Procedure(s) Performed: REPEAT CESAREAN SECTION EDC: 05-28-22  ALLERG: NKDA  PREVIOUS X 1     Patient location during evaluation: PACU Anesthesia Type: Spinal Level of consciousness: awake and alert and oriented Pain management: pain level controlled Vital Signs Assessment: post-procedure vital signs reviewed and stable Respiratory status: spontaneous breathing, nonlabored ventilation and respiratory function stable Cardiovascular status: blood pressure returned to baseline and stable Postop Assessment: no headache, no backache, spinal receding and patient able to bend at knees Anesthetic complications: no   No notable events documented.  Last Vitals:  Vitals:   05/19/22 2110 05/19/22 2210  BP: 126/78 118/72  Pulse: 70 67  Resp: 17 18  Temp: 36.9 C 36.8 C  SpO2: 100% 99%    Last Pain:  Vitals:   05/20/22 0051  TempSrc:   PainSc: 2    Pain Goal: Patients Stated Pain Goal: 0 (05/19/22 2210)              Epidural/Spinal Function Cutaneous sensation: Normal sensation (05/20/22 0051)  Pervis Hocking

## 2022-05-20 NOTE — Progress Notes (Signed)
Subjective: Postpartum Day 1: Cesarean Delivery Patient reports tolerating PO, + flatus, and no problems voiding.    Objective: Vital signs in last 24 hours: Temp:  [97.5 F (36.4 C)-98.8 F (37.1 C)] 97.5 F (36.4 C) (01/22 0500) Pulse Rate:  [67-88] 67 (01/21 2210) Resp:  [12-21] 16 (01/22 0500) BP: (103-137)/(54-98) 103/56 (01/22 0500) SpO2:  [97 %-100 %] 99 % (01/22 0600)  Physical Exam:  General: alert, cooperative, and appears stated age 42: appropriate Uterine Fundus: firm Incision: healing well, no significant drainage DVT Evaluation: No evidence of DVT seen on physical exam. Negative Homan's sign. No cords or calf tenderness.  Recent Labs    05/19/22 1754 05/20/22 0358  HGB 11.5* 8.4*  HCT 35.3* 26.2*    Assessment/Plan: Status post Cesarean section. Doing well postoperatively.  Continue current care. Acute blood loss anemia-FeSO4 started  Linda Hedges, DO 05/20/2022, 9:16 AM

## 2022-05-21 ENCOUNTER — Encounter (HOSPITAL_COMMUNITY): Payer: Self-pay | Admitting: Obstetrics and Gynecology

## 2022-05-21 MED ORDER — IBUPROFEN 600 MG PO TABS: 600.0000 mg | ORAL_TABLET | Freq: Four times a day (QID) | ORAL | 0 refills | Status: DC

## 2022-05-21 MED ORDER — OXYCODONE HCL 5 MG PO TABS: 5.0000 mg | ORAL_TABLET | ORAL | 0 refills | Status: DC | PRN

## 2022-05-21 MED ORDER — ACETAMINOPHEN 325 MG PO TABS: 650.0000 mg | ORAL_TABLET | ORAL | 1 refills | Status: DC | PRN

## 2022-05-21 MED ORDER — SENNOSIDES-DOCUSATE SODIUM 8.6-50 MG PO TABS: 2.0000 | ORAL_TABLET | ORAL | 1 refills | Status: AC

## 2022-05-21 NOTE — Lactation Note (Signed)
This note was copied from a baby's chart. Lactation Consultation Note  Patient Name: [name] [id number] Date: 05/21/2022 Reason for consult: Follow-up assessment Age:43 hours  Reviewed engorgement care and monitoring voids/stools. Encouraged breastfeeding before offering formula to help mother's milk supply.  Feeding Mother's Current Feeding Choice: Breast Milk and Formula Nipple Type: Slow - flow  Interventions Interventions: Education  Discharge Discharge Education: Engorgement and breast care;Warning signs for feeding baby  Consult Status Consult Status: Complete Date: 05/21/22    Vivianne Master [facility] 05/21/2022, 11:47 AM

## 2022-05-21 NOTE — Discharge Summary (Signed)
Postpartum Discharge Summary  Date of Service updated 05/21/2022     Patient Name: Brandi Hardy DOB: [birth year] MRN: [id number]  Date of admission: 05/19/2022 Delivery date:05/19/2022  Delivering provider: Louretta Shorten  Date of discharge: 05/21/2022  Admitting diagnosis: Previous cesarean section [Z98.891] Cesarean delivery delivered [O82] Intrauterine pregnancy: [redacted]w[redacted]d    Secondary diagnosis:  Principal Problem:   Previous cesarean section Active Problems:   Cesarean delivery delivered  Additional problems: Fibroid uterus, AMA    Discharge diagnosis: Term Pregnancy Delivered, acute blood loss anemia                                        Post partum procedures: None Augmentation: N/A Complications: None  Hospital course: Onset of Labor With Unplanned C/S   42y.o. yo [id number] admitted in Latent Labor on 05/19/2022. The patient went for cesarean section due to Prior Uterine Surgery (history of CS and lower uterine segment fibroid). Delivery details as follows: Membrane Rupture Time/Date: 7:16 PM ,05/19/2022   Delivery Method:C-Section, Low Transverse  Details of operation can be found in separate operative note. Patient had a postpartum course complicated by acute blood loss anemia - she remained asymptomatic and was continued on iron.  She is ambulating,tolerating a regular diet, passing flatus, and urinating well.  Patient is discharged home in stable condition 05/21/22.  Newborn Data: Birth date:05/19/2022  Birth time:7:16 PM  Gender:Female  Living status:Living  Apgars:8 ,9  Weight:3470 g   Magnesium Sulfate received: No BMZ received: No Rhophylac:No MMR:No T-DaP:Given prenatally Flu: No Transfusion:No  Physical exam  Vitals:   05/20/22 0800 05/20/22 1330 05/20/22 2148 05/21/22 0523  BP: (!) 103/55 110/62 111/67 117/78  Pulse: 73 68 86 66  Resp: '18 18 18 17  '$ Temp: 98.2 F (36.8 C) 97.6 F (36.4 C) 98.4 F (36.9 C) 98.2 F (36.8 C)   TempSrc: Oral Oral Axillary Oral  SpO2: 100% 100%  100%   General: alert, cooperative, and no distress Lochia: appropriate Uterine Fundus: firm Incision: Healing well with no significant drainage, honeycomb to be changed prior to discharge DVT Evaluation: No evidence of DVT seen on physical exam. Labs: Lab Results  Component Value Date   WBC 9.4 05/20/2022   HGB 8.4 (L) 05/20/2022   HCT 26.2 (L) 05/20/2022   MCV 86.5 05/20/2022   PLT 196 05/20/2022      Latest Ref Rng & Units 04/06/2017    7:46 AM  CMP  Glucose 65 - 99 mg/dL 95   BUN 6 - 20 mg/dL 15   Creatinine 0.44 - 1.00 mg/dL 0.71   Sodium 135 - 145 mmol/L 140   Potassium 3.5 - 5.1 mmol/L 3.7   Chloride 101 - 111 mmol/L 109   CO2 22 - 32 mmol/L 22   Calcium 8.9 - 10.3 mg/dL 9.0   Total Protein 6.5 - 8.1 g/dL 6.4   Total Bilirubin 0.3 - 1.2 mg/dL 0.4   Alkaline Phos 38 - 126 U/L 80   AST 15 - 41 U/L 26   ALT 14 - 54 U/L 59    Edinburgh Score:    05/20/2022   11:18 AM  Edinburgh Postnatal Depression Scale Screening Tool  I have been able to laugh and see the funny side of things. 0  I have looked forward with enjoyment to things. 0  I have blamed myself unnecessarily  when things went wrong. 2  I have been anxious or worried for no good reason. 0  I have felt scared or panicky for no good reason. 0  Things have been getting on top of me. 1  I have been so unhappy that I have had difficulty sleeping. 0  I have felt sad or miserable. 1  I have been so unhappy that I have been crying. 0  The thought of harming myself has occurred to me. 0  Edinburgh Postnatal Depression Scale Total 4      After visit meds:  Allergies as of 05/21/2022   No Known Allergies      Medication List     STOP taking these medications    docusate sodium 100 MG capsule Commonly known as: COLACE   iron polysaccharides 150 MG capsule Commonly known as: NIFEREX   metroNIDAZOLE 500 MG tablet Commonly known as: Flagyl        TAKE these medications    acetaminophen 325 MG tablet Commonly known as: TYLENOL Take 2 tablets (650 mg total) by mouth every 4 (four) hours as needed for mild pain (temperature > 101.5.).   ferrous sulfate 325 (65 FE) MG EC tablet Take 325 mg by mouth every other day.   ibuprofen 600 MG tablet Commonly known as: ADVIL Take 1 tablet (600 mg total) by mouth every 6 (six) hours.   oxyCODONE 5 MG immediate release tablet Commonly known as: Roxicodone Take 1 tablet (5 mg total) by mouth every 4 (four) hours as needed for severe pain.   prenatal multivitamin Tabs tablet Take 1 tablet by mouth daily at 12 noon.   senna-docusate 8.6-50 MG tablet Commonly known as: Senokot-S Take 2 tablets by mouth daily.         Discharge home in stable condition Infant Feeding: Bottle and Breast Infant Disposition:home with mother Discharge instruction: per After Visit Summary and Postpartum booklet. Activity: Advance as tolerated. Pelvic rest for 6 weeks.  Diet: routine diet Anticipated Birth Control: Unsure Postpartum Appointment:6 weeks Additional Postpartum F/U:  None Future Appointments:No future appointments.   05/21/2022 Charlotta Newton, MD

## 2022-05-22 ENCOUNTER — Other Ambulatory Visit (HOSPITAL_COMMUNITY)
Admission: RE | Admit: 2022-05-22 | Discharge: 2022-05-22 | Disposition: A | Discharge status: Home / Self Care | Payer: 59 | Source: Ambulatory Visit | Attending: Obstetrics and Gynecology | Admitting: Obstetrics and Gynecology

## 2022-05-24 ENCOUNTER — Inpatient Hospital Stay (HOSPITAL_COMMUNITY): Admit: 2022-05-24 | Payer: 59 | Admitting: Obstetrics and Gynecology

## 2022-05-27 ENCOUNTER — Telehealth (HOSPITAL_COMMUNITY): Payer: Self-pay | Admitting: *Deleted

## 2022-05-27 NOTE — Telephone Encounter (Signed)
Attempted hospital discharge follow-up call. Left message for patient to return RN call with any questions or concerns. Erline Levine, RN, 05/27/22, [phone]

## 2022-06-04 ENCOUNTER — Inpatient Hospital Stay (HOSPITAL_COMMUNITY)
Admission: AD | Admit: 2022-06-04 | Discharge: 2022-06-06 | DRG: 776 | Disposition: A | Discharge status: Home / Self Care | Payer: 59 | Attending: Obstetrics and Gynecology | Admitting: Obstetrics and Gynecology

## 2022-06-04 ENCOUNTER — Encounter (HOSPITAL_COMMUNITY): Payer: Self-pay | Admitting: Obstetrics and Gynecology

## 2022-06-04 ENCOUNTER — Other Ambulatory Visit: Payer: Self-pay

## 2022-06-04 DIAGNOSIS — O1415 Severe pre-eclampsia, complicating the puerperium: Secondary | ICD-10-CM | POA: Diagnosis not present

## 2022-06-04 DIAGNOSIS — I10 Essential (primary) hypertension: Secondary | ICD-10-CM

## 2022-06-04 DIAGNOSIS — O1495 Unspecified pre-eclampsia, complicating the puerperium: Secondary | ICD-10-CM | POA: Diagnosis present

## 2022-06-04 DIAGNOSIS — Z98891 History of uterine scar from previous surgery: Principal | ICD-10-CM

## 2022-06-04 LAB — COMPREHENSIVE METABOLIC PANEL
ALT: 43 U/L (ref 0–44)
AST: 27 U/L (ref 15–41)
Albumin: 3.6 g/dL (ref 3.5–5.0)
Alkaline Phosphatase: 80 U/L (ref 38–126)
Anion gap: 7 (ref 5–15)
BUN: 17 mg/dL (ref 6–20)
CO2: 24 mmol/L (ref 22–32)
Calcium: 9 mg/dL (ref 8.9–10.3)
Chloride: 107 mmol/L (ref 98–111)
Creatinine, Ser: 0.83 mg/dL (ref 0.44–1.00)
GFR, Estimated: >60 mL/min (ref >60)
Glucose, Bld: 84 mg/dL (ref 70–99)
Potassium: 3.8 mmol/L (ref 3.5–5.1)
Sodium: 138 mmol/L (ref 135–145)
Total Bilirubin: 0.2 mg/dL — ABNORMAL LOW (ref 0.3–1.2)
Total Protein: 7 g/dL (ref 6.5–8.1)

## 2022-06-04 LAB — CBC
HCT: 35.9 % — ABNORMAL LOW (ref 36.0–46.0)
Hemoglobin: 11.3 g/dL — ABNORMAL LOW (ref 12.0–15.0)
MCH: 27.4 pg (ref 26.0–34.0)
MCHC: 31.5 g/dL (ref 30.0–36.0)
MCV: 87.1 fL (ref 80.0–100.0)
Platelets: 335 10*3/uL (ref 150–400)
RBC: 4.12 MIL/uL (ref 3.87–5.11)
RDW: 14.3 % (ref 11.5–15.5)
WBC: 6.2 10*3/uL (ref 4.0–10.5)
nRBC: 0 % (ref 0.0–0.2)

## 2022-06-04 MED ORDER — LABETALOL HCL 5 MG/ML IV SOLN
20.0000 mg | INTRAVENOUS | Status: DC | PRN
  Administered 2022-06-04: 20 mg via INTRAVENOUS
  Filled 2022-06-04: qty 4

## 2022-06-04 MED ORDER — MAGNESIUM SULFATE 40 GM/1000ML IV SOLN
2.0000 g/h | INTRAVENOUS | Status: DC
  Administered 2022-06-04 – 2022-06-05 (×2): 2 g/h via INTRAVENOUS
  Filled 2022-06-04 (×2): qty 1000

## 2022-06-04 MED ORDER — LABETALOL HCL 5 MG/ML IV SOLN
40.0000 mg | INTRAVENOUS | Status: DC | PRN
  Administered 2022-06-04: 40 mg via INTRAVENOUS
  Filled 2022-06-04: qty 8

## 2022-06-04 MED ORDER — NIFEDIPINE 10 MG PO CAPS
10.0000 mg | ORAL_CAPSULE | ORAL | Status: DC | PRN
  Administered 2022-06-04: 10 mg via ORAL

## 2022-06-04 MED ORDER — NIFEDIPINE 10 MG PO CAPS: 20.0000 mg | ORAL_CAPSULE | ORAL | Status: DC | PRN

## 2022-06-04 MED ORDER — LABETALOL HCL 200 MG PO TABS: 400.0000 mg | ORAL_TABLET | Freq: Three times a day (TID) | ORAL | Status: DC

## 2022-06-04 MED ORDER — LABETALOL HCL 100 MG PO TABS
200.0000 mg | ORAL_TABLET | Freq: Three times a day (TID) | ORAL | Status: DC
  Administered 2022-06-04: 200 mg via ORAL
  Filled 2022-06-04: qty 2

## 2022-06-04 MED ORDER — MAGNESIUM SULFATE 40 GM/1000ML IV SOLN: 2.0000 g/h | INTRAVENOUS | Status: DC

## 2022-06-04 MED ORDER — IBUPROFEN 600 MG PO TABS: 600.0000 mg | ORAL_TABLET | Freq: Four times a day (QID) | ORAL | Status: DC | PRN

## 2022-06-04 MED ORDER — LACTATED RINGERS IV SOLN: INTRAVENOUS | Status: DC

## 2022-06-04 MED ORDER — LACTATED RINGERS IV BOLUS
1000.0000 mL | Freq: Once | INTRAVENOUS | Status: AC
  Administered 2022-06-04: 1000 mL via INTRAVENOUS

## 2022-06-04 MED ORDER — LABETALOL HCL 5 MG/ML IV SOLN: 40.0000 mg | INTRAVENOUS | Status: DC | PRN

## 2022-06-04 MED ORDER — NIFEDIPINE 10 MG PO CAPS
20.0000 mg | ORAL_CAPSULE | ORAL | Status: DC | PRN
  Filled 2022-06-04: qty 2

## 2022-06-04 MED ORDER — LABETALOL HCL 200 MG PO TABS
200.0000 mg | ORAL_TABLET | Freq: Three times a day (TID) | ORAL | Status: DC
  Administered 2022-06-04: 200 mg via ORAL
  Filled 2022-06-04: qty 1

## 2022-06-04 MED ORDER — MAGNESIUM SULFATE BOLUS VIA INFUSION
4.0000 g | Freq: Once | INTRAVENOUS | Status: AC
  Administered 2022-06-04: 4 g via INTRAVENOUS
  Filled 2022-06-04: qty 1000

## 2022-06-04 MED ORDER — LABETALOL HCL 5 MG/ML IV SOLN
80.0000 mg | INTRAVENOUS | Status: DC | PRN
  Administered 2022-06-04: 80 mg via INTRAVENOUS
  Filled 2022-06-04: qty 16

## 2022-06-04 MED ORDER — NIFEDIPINE 10 MG PO CAPS: 10.0000 mg | ORAL_CAPSULE | ORAL | Status: DC | PRN

## 2022-06-04 MED ORDER — HYDRALAZINE HCL 20 MG/ML IJ SOLN
10.0000 mg | INTRAMUSCULAR | Status: DC | PRN
  Administered 2022-06-04: 10 mg via INTRAVENOUS
  Filled 2022-06-04: qty 1

## 2022-06-04 MED ORDER — MAGNESIUM SULFATE BOLUS VIA INFUSION
4.0000 g | Freq: Once | INTRAVENOUS | Status: DC
  Filled 2022-06-04: qty 1000

## 2022-06-04 NOTE — H&P (Signed)
Admission History and Physical  Brandi Hardy is a 42 y.o. [id number] presenting for evaluation of severe range blood pressures in the office. She was seen by her home health nurse today and found to have severe range Bps as well. She denies HA, RUQ pain, vision changes. No lower extremity swelling currently - does have intermittent increased swelling in her right foot that resolves with elevation. Has had minimal vaginal bleeding since she was seen in the office (TVUS then with normal EMS).  In the MAU, her Bps have required treatment with the labetalol protocol. Now started the procardia protocol.  OB History     Gravida  4   Para  2   Term  2   Preterm  0   AB  2   Living  2      SAB  2   IAB  0   Ectopic  0   Multiple  0   Live Births  2          Past Medical History:  Diagnosis Date   Fibroid    GERD (gastroesophageal reflux disease)    Malaria 1988   Past Surgical History:  Procedure Laterality Date   CESAREAN SECTION N/A 03/28/2017   Procedure: CESAREAN SECTION;  Surgeon: Dian Queen, MD;  Location: [facility];  Service: Obstetrics;  Laterality: N/A;   CESAREAN SECTION N/A 05/19/2022   Procedure: REPEAT CESAREAN SECTION EDC: 05-28-22  ALLERG: NKDA  PREVIOUS X 1;  Surgeon: Louretta Shorten, MD;  Location: [facility];  Service: Obstetrics;  Laterality: N/A;   NO PAST SURGERIES     Family History: family history includes Diabetes in her mother; Hypertension in her father; Parkinson's disease in her father; Stroke in her mother. Social History:  reports that she has never smoked. She has never used smokeless tobacco. She reports that she does not currently use alcohol. She reports that she does not use drugs.     06/04/2022    7:30 PM 06/04/2022    7:22 PM 06/04/2022    7:10 PM  Vitals with BMI  Systolic 353 299 242  Diastolic 93 88 82  Pulse 79 74 71   Vitals and nursing note reviewed. Exam conducted with a chaperone present.  Constitutional:       Appearance: Normal appearance.  HENT:     Head: Normocephalic.  Eyes:     Pupils: Pupils are equal, round Cardiovascular:     Rate and Rhythm: Normal rate and regular rhythm.     Pulses: Normal pulses.  Abdominal:     General: Abdomen is nontender, nondistended. Pfannenstiel incision is well-healed. Neurological:     Mental Status: She is alert. DTRs normal.  Recent Results (from the past 2160 hour(s))  POCT fern test     Status: None   Collection Time: 05/19/22  3:50 PM  Result Value Ref Range   POCT Fern Test Negative = intact amniotic membranes   Type and screen     Status: None   Collection Time: 05/19/22  5:30 PM  Result Value Ref Range   ABO/RH(D) A POS    Antibody Screen NEG    Sample Expiration      05/22/2022,2359 Performed at [facility], [place] [address] [place], [place] [address]   RPR     Status: None   Collection Time: 05/19/22  5:54 PM  Result Value Ref Range   RPR Ser Ql NON REACTIVE NON REACTIVE    Comment: Performed  at [facility], [place] [address] [place], [place] [address]  CBC     Status: Abnormal   Collection Time: 05/19/22  5:54 PM  Result Value Ref Range   WBC 6.9 4.0 - 10.5 K/uL   RBC 4.07 3.87 - 5.11 MIL/uL   Hemoglobin 11.5 (L) 12.0 - 15.0 g/dL   HCT 35.3 (L) 36.0 - 46.0 %   MCV 86.7 80.0 - 100.0 fL   MCH 28.3 26.0 - 34.0 pg   MCHC 32.6 30.0 - 36.0 g/dL   RDW 14.6 11.5 - 15.5 %   Platelets 223 150 - 400 K/uL   nRBC 0.0 0.0 - 0.2 %    Comment: Performed at [facility], [place] [address] [place], [place] [address]  CBC     Status: Abnormal   Collection Time: 05/20/22  3:58 AM  Result Value Ref Range   WBC 9.4 4.0 - 10.5 K/uL   RBC 3.03 (L) 3.87 - 5.11 MIL/uL   Hemoglobin 8.4 (L) 12.0 - 15.0 g/dL    Comment: REPEATED TO VERIFY DELTA CHECK NOTED    HCT 26.2 (L) 36.0 - 46.0 %   MCV 86.5 80.0 - 100.0 fL   MCH 27.7 26.0 - 34.0 pg   MCHC 32.1 30.0 - 36.0 g/dL   RDW 14.6 11.5 - 15.5 %   Platelets 196 150 - 400 K/uL    nRBC 0.0 0.0 - 0.2 %    Comment: Performed at [facility], [place] [address], [place], [place] [address]  Comprehensive metabolic panel     Status: Abnormal   Collection Time: 06/04/22  5:25 PM  Result Value Ref Range   Sodium 138 135 - 145 mmol/L   Potassium 3.8 3.5 - 5.1 mmol/L   Chloride 107 98 - 111 mmol/L   CO2 24 22 - 32 mmol/L   Glucose, Bld 84 70 - 99 mg/dL    Comment: Glucose reference range applies only to samples taken after fasting for at least 8 hours.   BUN 17 6 - 20 mg/dL   Creatinine, Ser 0.83 0.44 - 1.00 mg/dL   Calcium 9.0 8.9 - 10.3 mg/dL   Total Protein 7.0 6.5 - 8.1 g/dL   Albumin 3.6 3.5 - 5.0 g/dL   AST 27 15 - 41 U/L   ALT 43 0 - 44 U/L   Alkaline Phosphatase 80 38 - 126 U/L   Total Bilirubin 0.2 (L) 0.3 - 1.2 mg/dL   GFR, Estimated >60 >60 mL/min    Comment: (NOTE) Calculated using the CKD-EPI Creatinine Equation (2021)    Anion gap 7 5 - 15    Comment: Performed at [facility] [address] [place], [place] [address]  CBC     Status: Abnormal   Collection Time: 06/04/22  5:25 PM  Result Value Ref Range   WBC 6.2 4.0 - 10.5 K/uL   RBC 4.12 3.87 - 5.11 MIL/uL   Hemoglobin 11.3 (L) 12.0 - 15.0 g/dL   HCT 35.9 (L) 36.0 - 46.0 %   MCV 87.1 80.0 - 100.0 fL   MCH 27.4 26.0 - 34.0 pg   MCHC 31.5 30.0 - 36.0 g/dL   RDW 14.3 11.5 - 15.5 %   Platelets 335 150 - 400 K/uL   nRBC 0.0 0.0 - 0.2 %    Comment: Performed at [facility], [place] [address], [place], [place] [address]    Assessment/Plan: Brandi Hardy is a 42  y.o. [id number] admitted for postpartum severe preeclampsia.  Severe range pressures requiring treatment with IV antihypertensives in MAU - will start labetalol '200mg'$  TID and uptitrate as necessary. Discussed with patient necessity to start magnesium for seizure ppx x24 hours. Asx from preE perspective. CBC, CMP wnl. Will order another set of labs for tomorrow AM.  Dispo: admit for magnesium, BP monitoring in setting of  pp severe preE  Charlotta Newton 06/04/2022, 7:59 PM

## 2022-06-04 NOTE — MAU Note (Signed)
Brandi Hardy is a 42 y.o. here in MAU reporting: s/p c/s on 1/21. Home health nurse was at her house today and BP was 160/105 and then [facility] and BP was still high and was advised to come to the hospital. Swelling in feet. No headache, visual changes, or RUQ pain. Does endorse some neck pain.   Onset of complaint: today  Pain score: 4/10  Vitals:   06/04/22 1652  BP: (!) 180/101  Pulse: 70  Resp: 16  Temp: 98.6 F (37 C)  SpO2: 99%     FHT:NA  Lab orders placed from triage: none

## 2022-06-04 NOTE — MAU Provider Note (Signed)
History     CSN: [id number]  Arrival date and time: 06/04/22 1633   Event Date/Time   First Provider Initiated Contact with Patient 06/04/22 1711      Chief Complaint  Patient presents with   Hypertension   Brandi Hardy is a 42 y.o. year old G72P2022 female at 2 weeks PP after RCS (05/19/2022) who presents to MAU reporting high BP when home health RN came to house today. She reports her BP was 160/105. [facility] went to [facility] and her BP was still high. She was advised to come to the hospital for evaluation. She reports swelling her both of her feet and neck pain; rated 4/10. She denies H/A, visual disturbances or RUQ pain.   OB History     Gravida  4   Para  2   Term  2   Preterm  0   AB  2   Living  2      SAB  2   IAB  0   Ectopic  0   Multiple  0   Live Births  2           Past Medical History:  Diagnosis Date   Fibroid    GERD (gastroesophageal reflux disease)    Malaria 1988    Past Surgical History:  Procedure Laterality Date   CESAREAN SECTION N/A 03/28/2017   Procedure: CESAREAN SECTION;  Surgeon: Dian Queen, MD;  Location: [facility];  Service: Obstetrics;  Laterality: N/A;   CESAREAN SECTION N/A 05/19/2022   Procedure: REPEAT CESAREAN SECTION EDC: 05-28-22  ALLERG: NKDA  PREVIOUS X 1;  Surgeon: Louretta Shorten, MD;  Location: [facility]S;  Service: Obstetrics;  Laterality: N/A;   NO PAST SURGERIES      Family History  Problem Relation Age of Onset   Diabetes Mother    Stroke Mother    Hypertension Father    Parkinson's disease Father     Social History   Tobacco Use   Smoking status: Never   Smokeless tobacco: Never  Vaping Use   Vaping Use: Never used  Substance Use Topics   Alcohol use: Not Currently    Comment: occasional; not while preg   Drug use: No    Allergies: No Known Allergies  Medications Prior to Admission  Medication Sig Dispense Refill Last Dose   acetaminophen (TYLENOL) 325 MG  tablet Take 2 tablets (650 mg total) by mouth every 4 (four) hours as needed for mild pain (temperature > 101.5.). 30 tablet 1    ferrous sulfate 325 (65 FE) MG EC tablet Take 325 mg by mouth every other day.      ibuprofen (ADVIL) 600 MG tablet Take 1 tablet (600 mg total) by mouth every 6 (six) hours. 30 tablet 0    oxyCODONE (ROXICODONE) 5 MG immediate release tablet Take 1 tablet (5 mg total) by mouth every 4 (four) hours as needed for severe pain. 15 tablet 0    Prenatal Vit-Fe Fumarate-FA (PRENATAL MULTIVITAMIN) TABS tablet Take 1 tablet by mouth daily at 12 noon.      senna-docusate (SENOKOT-S) 8.6-50 MG tablet Take 2 tablets by mouth daily. 30 tablet 1     Review of Systems  Constitutional: Negative.   HENT: Negative.    Eyes: Negative.   Respiratory: Negative.    Cardiovascular:  Positive for leg swelling (Bilateral ankles and feet; R>L).  Endocrine: Negative.   Musculoskeletal: Negative.   Skin: Negative.   Allergic/Immunologic:  Negative.   Neurological:  Negative for headaches.  Hematological: Negative.   Psychiatric/Behavioral: Negative.     Physical Exam   Patient Vitals for the past 24 hrs:  BP Temp Temp src Pulse Resp SpO2 Height Weight  06/04/22 1930 (!) 167/93 -- -- 79 -- -- -- --  06/04/22 1922 (!) 155/88 -- -- 79 -- -- -- --  06/04/22 1910 (!) 157/82 -- -- 59 -- -- -- --  06/04/22 1845 (!) 164/89 -- -- 100 -- -- -- --  06/04/22 1834 (!) 156/89 -- -- 31 -- -- -- --  06/04/22 1830 (!) 161/88 -- -- 22 -- -- -- --  06/04/22 1816 (!) 174/90 -- -- 72 -- -- -- --  06/04/22 1805 (!) 159/84 -- -- 3 -- -- -- --  06/04/22 1740 (!) 152/89 -- -- 65 -- -- -- --  06/04/22 1732 (!) 165/87 -- -- 62 -- -- -- --  06/04/22 1708 (!) 173/95 -- -- 62 -- -- -- --  06/04/22 1652 (!) 180/101 98.6 F (37 C) Oral 70 16 99 % -- --  06/04/22 1648 -- -- -- -- -- -- '5\' 7"'$  (1.702 m) 92.5 kg    Physical Exam Vitals and nursing note reviewed. Exam conducted with a chaperone present.   Constitutional:      Appearance: Normal appearance. She is obese.  HENT:     Head: Normocephalic and atraumatic.  Cardiovascular:     Rate and Rhythm: Normal rate and regular rhythm.     Pulses: Normal pulses.     Heart sounds: Normal heart sounds.  Pulmonary:     Effort: Pulmonary effort is normal.     Breath sounds: Normal breath sounds.  Abdominal:     General: Bowel sounds are normal.     Palpations: Abdomen is soft.  Genitourinary:    Comments: Not indicated Musculoskeletal:        General: Normal range of motion.     Right lower leg: Edema (2+ pitting) present.     Left lower leg: Edema (1+ pitting) present.  Skin:    General: Skin is warm and dry.  Neurological:     Mental Status: She is alert and oriented to person, place, and time.  Psychiatric:        Mood and Affect: Mood normal.        Behavior: Behavior normal.        Thought Content: Thought content normal.        Judgment: Judgment normal.    MAU Course  Procedures  MDM CCUA CBC CMP P/C Ratio Serial BP's  Start IV LR 1000 ml @ 125 ml/hr Labetalol Protocol  Results for orders placed or performed during the hospital encounter of 06/04/22 (from the past 24 hour(s))  Comprehensive metabolic panel     Status: Abnormal   Collection Time: 06/04/22  5:25 PM  Result Value Ref Range   Sodium 138 135 - 145 mmol/L   Potassium 3.8 3.5 - 5.1 mmol/L   Chloride 107 98 - 111 mmol/L   CO2 24 22 - 32 mmol/L   Glucose, Bld 84 70 - 99 mg/dL   BUN 17 6 - 20 mg/dL   Creatinine, Ser 0.83 0.44 - 1.00 mg/dL   Calcium 9.0 8.9 - 10.3 mg/dL   Total Protein 7.0 6.5 - 8.1 g/dL   Albumin 3.6 3.5 - 5.0 g/dL   AST 27 15 - 41 U/L   ALT 43 0 - 44 U/L   Alkaline  Phosphatase 80 38 - 126 U/L   Total Bilirubin 0.2 (L) 0.3 - 1.2 mg/dL   GFR, Estimated >60 >60 mL/min   Anion gap 7 5 - 15  CBC     Status: Abnormal   Collection Time: 06/04/22  5:25 PM  Result Value Ref Range   WBC 6.2 4.0 - 10.5 K/uL   RBC 4.12 3.87 - 5.11  MIL/uL   Hemoglobin 11.3 (L) 12.0 - 15.0 g/dL   HCT 35.9 (L) 36.0 - 46.0 %   MCV 87.1 80.0 - 100.0 fL   MCH 27.4 26.0 - 34.0 pg   MCHC 31.5 30.0 - 36.0 g/dL   RDW 14.3 11.5 - 15.5 %   Platelets 335 150 - 400 K/uL   nRBC 0.0 0.0 - 0.2 %     *Consult with Dr. Bridgett Larsson @ [phone] - notified of patient's complaints, assessments, lab results, tx plan admit to [facility] for PP PEC - agrees with plan  Assessment and Plan  1. Preeclampsia in postpartum period - Admit to OBSCU - Routine PP PEC admission orders - See Dr. Lianne Moris H&P documentation and orders for admission POC   Brandi Hardy, CNM 06/04/2022, 5:11 PM

## 2022-06-05 DIAGNOSIS — O1415 Severe pre-eclampsia, complicating the puerperium: Secondary | ICD-10-CM | POA: Diagnosis present

## 2022-06-05 LAB — CBC
HCT: 34.8 % — ABNORMAL LOW (ref 36.0–46.0)
Hemoglobin: 10.8 g/dL — ABNORMAL LOW (ref 12.0–15.0)
MCH: 26.7 pg (ref 26.0–34.0)
MCHC: 31 g/dL (ref 30.0–36.0)
MCV: 86.1 fL (ref 80.0–100.0)
Platelets: 351 10*3/uL (ref 150–400)
RBC: 4.04 MIL/uL (ref 3.87–5.11)
RDW: 14.2 % (ref 11.5–15.5)
WBC: 7.8 10*3/uL (ref 4.0–10.5)
nRBC: 0 % (ref 0.0–0.2)

## 2022-06-05 LAB — COMPREHENSIVE METABOLIC PANEL
ALT: 39 U/L (ref 0–44)
AST: 27 U/L (ref 15–41)
Albumin: 3.4 g/dL — ABNORMAL LOW (ref 3.5–5.0)
Alkaline Phosphatase: 74 U/L (ref 38–126)
Anion gap: 12 (ref 5–15)
BUN: 15 mg/dL (ref 6–20)
CO2: 22 mmol/L (ref 22–32)
Calcium: 8.4 mg/dL — ABNORMAL LOW (ref 8.9–10.3)
Chloride: 103 mmol/L (ref 98–111)
Creatinine, Ser: 0.77 mg/dL (ref 0.44–1.00)
GFR, Estimated: >60 mL/min (ref >60)
Glucose, Bld: 106 mg/dL — ABNORMAL HIGH (ref 70–99)
Potassium: 4.1 mmol/L (ref 3.5–5.1)
Sodium: 137 mmol/L (ref 135–145)
Total Bilirubin: <0.1 mg/dL — ABNORMAL LOW (ref 0.3–1.2)
Total Protein: 6.6 g/dL (ref 6.5–8.1)

## 2022-06-05 MED ORDER — FUROSEMIDE 10 MG/ML IJ SOLN
20.0000 mg | Freq: Once | INTRAMUSCULAR | Status: AC
  Administered 2022-06-05: 20 mg via INTRAVENOUS
  Filled 2022-06-05: qty 2

## 2022-06-05 MED ORDER — LABETALOL HCL 200 MG PO TABS
300.0000 mg | ORAL_TABLET | Freq: Three times a day (TID) | ORAL | Status: DC
  Administered 2022-06-05 – 2022-06-06 (×4): 300 mg via ORAL
  Filled 2022-06-05 (×4): qty 1

## 2022-06-05 NOTE — Progress Notes (Signed)
Post partum ( status post C Section 1/21/) Readmitted yesterday for post partum preeclampsia Scheduled Meds:  labetalol  300 mg Oral TID   Continuous Infusions:  lactated ringers 10 mL/hr at 06/04/22 2051   magnesium sulfate 2 g/hr (06/04/22 2053)   PRN Meds:.labetalol **AND** labetalol **AND** labetalol **AND** hydrALAZINE **AND** [COMPLETED] Measure blood pressure, ibuprofen, [DISCONTINUED] NIFEdipine **AND** [DISCONTINUED] NIFEdipine **AND** [DISCONTINUED] NIFEdipine **AND** labetalol **AND** Measure blood pressure, NIFEdipine **AND** NIFEdipine **AND** NIFEdipine **AND** labetalol **AND** Measure blood pressure  S:  this am patient is noticing a headache O: BP (!) 140/86   Pulse 75   Temp 98.7 F (37.1 C)   Resp 18   Ht '5\' 7"'$  (1.702 m)   Wt 92.5 kg   SpO2 98%   Breastfeeding Yes   BMI 31.94 kg/m  Results for orders placed or performed during the hospital encounter of 06/04/22 (from the past 24 hour(s))  Comprehensive metabolic panel     Status: Abnormal   Collection Time: 06/04/22  5:25 PM  Result Value Ref Range   Sodium 138 135 - 145 mmol/L   Potassium 3.8 3.5 - 5.1 mmol/L   Chloride 107 98 - 111 mmol/L   CO2 24 22 - 32 mmol/L   Glucose, Bld 84 70 - 99 mg/dL   BUN 17 6 - 20 mg/dL   Creatinine, Ser 0.83 0.44 - 1.00 mg/dL   Calcium 9.0 8.9 - 10.3 mg/dL   Total Protein 7.0 6.5 - 8.1 g/dL   Albumin 3.6 3.5 - 5.0 g/dL   AST 27 15 - 41 U/L   ALT 43 0 - 44 U/L   Alkaline Phosphatase 80 38 - 126 U/L   Total Bilirubin 0.2 (L) 0.3 - 1.2 mg/dL   GFR, Estimated >60 >60 mL/min   Anion gap 7 5 - 15  CBC     Status: Abnormal   Collection Time: 06/04/22  5:25 PM  Result Value Ref Range   WBC 6.2 4.0 - 10.5 K/uL   RBC 4.12 3.87 - 5.11 MIL/uL   Hemoglobin 11.3 (L) 12.0 - 15.0 g/dL   HCT 35.9 (L) 36.0 - 46.0 %   MCV 87.1 80.0 - 100.0 fL   MCH 27.4 26.0 - 34.0 pg   MCHC 31.5 30.0 - 36.0 g/dL   RDW 14.3 11.5 - 15.5 %   Platelets 335 150 - 400 K/uL   nRBC 0.0 0.0 - 0.2 %  CBC      Status: Abnormal   Collection Time: 06/05/22  4:29 AM  Result Value Ref Range   WBC 7.8 4.0 - 10.5 K/uL   RBC 4.04 3.87 - 5.11 MIL/uL   Hemoglobin 10.8 (L) 12.0 - 15.0 g/dL   HCT 34.8 (L) 36.0 - 46.0 %   MCV 86.1 80.0 - 100.0 fL   MCH 26.7 26.0 - 34.0 pg   MCHC 31.0 30.0 - 36.0 g/dL   RDW 14.2 11.5 - 15.5 %   Platelets 351 150 - 400 K/uL   nRBC 0.0 0.0 - 0.2 %  Comprehensive metabolic panel     Status: Abnormal   Collection Time: 06/05/22  4:29 AM  Result Value Ref Range   Sodium 137 135 - 145 mmol/L   Potassium 4.1 3.5 - 5.1 mmol/L   Chloride 103 98 - 111 mmol/L   CO2 22 22 - 32 mmol/L   Glucose, Bld 106 (H) 70 - 99 mg/dL   BUN 15 6 - 20 mg/dL   Creatinine, Ser 0.77 0.44 - 1.00  mg/dL   Calcium 8.4 (L) 8.9 - 10.3 mg/dL   Total Protein 6.6 6.5 - 8.1 g/dL   Albumin 3.4 (L) 3.5 - 5.0 g/dL   AST 27 15 - 41 U/L   ALT 39 0 - 44 U/L   Alkaline Phosphatase 74 38 - 126 U/L   Total Bilirubin <0.1 (L) 0.3 - 1.2 mg/dL   GFR, Estimated >60 >60 mL/min   Anion gap 12 5 - 15   Abodmen is soft and non tender Some edema in lower extremities  IMPRESSION: Post op c section Post partum preeclampsia  PLAN: Discontinue magnesium tonight 8 pm Increase labetalol to 300 mg po bid Lasix today  Possible discharge home tomorrow

## 2022-06-06 MED ORDER — LABETALOL HCL 300 MG PO TABS: 300.0000 mg | ORAL_TABLET | Freq: Three times a day (TID) | ORAL | 1 refills | Status: AC

## 2022-06-06 NOTE — Plan of Care (Signed)
Pt to be discharged with printed instruction. Toya Smothers, RN

## 2022-06-06 NOTE — Discharge Summary (Signed)
Postpartum Discharge Summary      Patient Name: Brandi Hardy DOB: 1981-02-04 MRN: [id number]  Date of admission: 06/04/2022 Delivery date:This patient has no babies on file. Delivering provider: This patient has no babies on file. Date of discharge: 06/06/2022  Admitting diagnosis: Preeclampsia in postpartum period [O14.95] Intrauterine pregnancy: Unknown     Secondary diagnosis:  Principal Problem:   Preeclampsia in postpartum period  Additional problems: None    Discharge diagnosis:  Postpartum pre-eclampsia                                               Post partum procedures: IV Magnesium, BP treatment  Hospital course: 42 yo G4P2002 presenting two weeks postop CS with severe PIH. She was given IV meds in the MAU, started on IV Mag and continued for 24 hours. Her Labetalol was increased to '300mg'$  TID.  She remained stable off of Magnesium and with Labetalol dosage. She was without s/s PIH and meeting all goals at dc.   Physical exam  Vitals:   06/05/22 1925 06/05/22 2358 06/06/22 0327 06/06/22 0800  BP: 134/80 116/70 120/71 122/76  Pulse: 75 79 71 76  Resp: '18 17 18 18  '$ Temp: 97.9 F (36.6 C) 97.9 F (36.6 C) 98.2 F (36.8 C) 99 F (37.2 C)  TempSrc: Oral Oral Oral Oral  SpO2: 100% 98% 100% 97%  Weight:      Height:       General: alert and cooperative Lochia: appropriate Uterine Fundus: firm Incision: Healing well with no significant drainage DVT Evaluation: No evidence of DVT seen on physical exam. Labs: Lab Results  Component Value Date   WBC 7.8 06/05/2022   HGB 10.8 (L) 06/05/2022   HCT 34.8 (L) 06/05/2022   MCV 86.1 06/05/2022   PLT 351 06/05/2022      Latest Ref Rng & Units 06/05/2022    4:29 AM  CMP  Glucose 70 - 99 mg/dL 106   BUN 6 - 20 mg/dL 15   Creatinine 0.44 - 1.00 mg/dL 0.77   Sodium 135 - 145 mmol/L 137   Potassium 3.5 - 5.1 mmol/L 4.1   Chloride 98 - 111 mmol/L 103   CO2 22 - 32 mmol/L 22   Calcium 8.9 - 10.3 mg/dL 8.4    Total Protein 6.5 - 8.1 g/dL 6.6   Total Bilirubin 0.3 - 1.2 mg/dL <0.1   Alkaline Phos 38 - 126 U/L 74   AST 15 - 41 U/L 27   ALT 0 - 44 U/L 39    Edinburgh Score:    05/20/2022   11:18 AM  Edinburgh Postnatal Depression Scale Screening Tool  I have been able to laugh and see the funny side of things. 0  I have looked forward with enjoyment to things. 0  I have blamed myself unnecessarily when things went wrong. 2  I have been anxious or worried for no good reason. 0  I have felt scared or panicky for no good reason. 0  Things have been getting on top of me. 1  I have been so unhappy that I have had difficulty sleeping. 0  I have felt sad or miserable. 1  I have been so unhappy that I have been crying. 0  The thought of harming myself has occurred to me. 0  Edinburgh Postnatal Depression Scale Total  4      After visit meds:  Allergies as of 06/06/2022   No Known Allergies      Medication List     TAKE these medications    labetalol 300 MG tablet Commonly known as: NORMODYNE Take 1 tablet (300 mg total) by mouth 3 (three) times daily.   prenatal multivitamin Tabs tablet Take 1 tablet by mouth daily at 12 noon.   senna-docusate 8.6-50 MG tablet Commonly known as: Senokot-S Take 2 tablets by mouth daily.         Discharge home in stable condition Infant Feeding: Bottle and Breast Infant Disposition:home with mother Discharge instruction: per After Visit Summary and Postpartum booklet. Activity: Advance as tolerated. Pelvic rest for 6 weeks.  Diet: routine diet Anticipated Birth Control: Unsure Postpartum Appointment:2-3 days Additional Postpartum F/U:  None Future Appointments:No future appointments. Follow up Visit:      06/06/2022 Tyson Dense, MD

## 2022-09-13 ENCOUNTER — Other Ambulatory Visit: Payer: Self-pay | Admitting: Internal Medicine

## 2022-09-14 LAB — COMPLETE METABOLIC PANEL WITH GFR
AG Ratio: 1.6 (calc) (ref 1.0–2.5)
ALT: 15 U/L (ref 6–29)
AST: 16 U/L (ref 10–30)
Albumin: 4.4 g/dL (ref 3.6–5.1)
Alkaline phosphatase (APISO): 54 U/L (ref 31–125)
BUN: 12 mg/dL (ref 7–25)
CO2: 20 mmol/L (ref 20–32)
Calcium: 9.4 mg/dL (ref 8.6–10.2)
Chloride: 105 mmol/L (ref 98–110)
Creat: 0.82 mg/dL (ref 0.50–0.99)
Globulin: 2.8 g/dL (calc) (ref 1.9–3.7)
Glucose, Bld: 78 mg/dL (ref 65–99)
Potassium: 4 mmol/L (ref 3.5–5.3)
Sodium: 137 mmol/L (ref 135–146)
Total Bilirubin: 0.4 mg/dL (ref 0.2–1.2)
Total Protein: 7.2 g/dL (ref 6.1–8.1)
eGFR: 92 mL/min/{1.73_m2} (ref >=60)

## 2022-09-14 LAB — VITAMIN D 25 HYDROXY (VIT D DEFICIENCY, FRACTURES): Vit D, 25-Hydroxy: 24 ng/mL — ABNORMAL LOW (ref 30–100)

## 2022-09-14 LAB — LIPID PANEL
Cholesterol: 188 mg/dL (ref <200)
HDL: 61 mg/dL (ref >=50)
LDL Cholesterol (Calc): 110 mg/dL (calc) — ABNORMAL HIGH
Non-HDL Cholesterol (Calc): 127 mg/dL (calc) (ref <130)
Total CHOL/HDL Ratio: 3.1 (calc) (ref <5.0)
Triglycerides: 82 mg/dL (ref <150)

## 2022-09-14 LAB — TSH: TSH: 1.7 mIU/L

## 2022-09-14 LAB — CBC
HCT: 39.1 % (ref 35.0–45.0)
Hemoglobin: 12.3 g/dL (ref 11.7–15.5)
MCH: 25.9 pg — ABNORMAL LOW (ref 27.0–33.0)
MCHC: 31.5 g/dL — ABNORMAL LOW (ref 32.0–36.0)
MCV: 82.5 fL (ref 80.0–100.0)
MPV: 11.4 fL (ref 7.5–12.5)
Platelets: 270 10*3/uL (ref 140–400)
RBC: 4.74 10*6/uL (ref 3.80–5.10)
RDW: 14.4 % (ref 11.0–15.0)
WBC: 4.9 10*3/uL (ref 3.8–10.8)

## 2023-08-25 ENCOUNTER — Encounter: Payer: Self-pay | Admitting: Podiatry

## 2023-08-25 ENCOUNTER — Ambulatory Visit: Admitting: Podiatry

## 2023-08-25 ENCOUNTER — Ambulatory Visit (INDEPENDENT_AMBULATORY_CARE_PROVIDER_SITE_OTHER)

## 2023-08-25 DIAGNOSIS — M7751 Other enthesopathy of right foot: Secondary | ICD-10-CM | POA: Diagnosis not present

## 2023-08-25 DIAGNOSIS — R600 Localized edema: Secondary | ICD-10-CM

## 2023-08-25 MED ORDER — MELOXICAM 15 MG PO TABS: 15.0000 mg | ORAL_TABLET | Freq: Every day | ORAL | 0 refills | Status: AC

## 2023-08-25 NOTE — Progress Notes (Signed)
   Chief Complaint  Patient presents with   Foot Swelling    "I have some swelling." N - swelling L - lateral ankle D - 2 mos O - suddenly, gotten better C - swelling, it was a sharp pain before, bigger A - shoes, walking and standing for a long time T - OB Gyn     HPI: 43 y.o. female presenting today as a new patient for evaluation of swelling with tenderness to the lateral aspect of the right ankle.  Onset about 1 month ago.  Patient states that over the past 1 month there has been significant improvement.  She says she is about 90% better but she continues to have some lingering pain.  Past Medical History:  Diagnosis Date   Fibroid    GERD (gastroesophageal reflux disease)    Malaria 1988    Past Surgical History:  Procedure Laterality Date   CESAREAN SECTION N/A 03/28/2017   Procedure: CESAREAN SECTION;  Surgeon: Thurman Flores, MD;  Location: [facility] BIRTHING SUITES;  Service: Obstetrics;  Laterality: N/A;   CESAREAN SECTION N/A 05/19/2022   Procedure: REPEAT CESAREAN SECTION EDC: 05-28-22  ALLERG: NKDA  PREVIOUS X 1;  Surgeon: Belle Box, MD;  Location: [facility] LD ORS;  Service: Obstetrics;  Laterality: N/A;   NO PAST SURGERIES      No Known Allergies   Physical Exam: General: The patient is alert and oriented x3 in no acute distress.  Dermatology: Skin is warm, dry and supple bilateral lower extremities.   Vascular: Palpable pedal pulses bilaterally. Capillary refill within normal limits.  Mild edema noted right ankle  Neurological: Grossly intact via light touch  Musculoskeletal Exam: No pedal deformities noted.  There is some slight tenderness with palpation to the anterolateral aspect of the right ankle  Radiographic Exam RT ankle 08/25/2023:  Normal osseous mineralization. Joint spaces preserved.  No fractures or osseous irregularities noted.  Impression: Negative  Assessment/Plan of Care: 1.  Capsulitis/possible ankle sprain right x 4 weeks  -Patient evaluated.   X-rays reviewed -Patient states that over the past month there has been significant improvement.  Will pursue conservative care for now -Compression ankle sleeve dispensed.  Wear daily as needed over the next 3 weeks -Recommend good supportive tennis shoes and sneakers -Prescription for meloxicam 15 mg daily as needed -Return to clinic as needed     Dot Gazella, DPM [facility]  Dr. Dot Gazella, DPM    [address] [place], [place] [address]                Office [phone]  Fax [phone]
